# Patient Record
Sex: Male | Born: 1950 | Race: Black or African American | Hispanic: No | Marital: Married | Smoking: Current every day smoker
Health system: Southern US, Community
[De-identification: ages and names within clinical notes are randomized; demographics above are authoritative.]

## PROBLEM LIST (undated history)

## (undated) DIAGNOSIS — I10 Essential (primary) hypertension: Secondary | ICD-10-CM

## (undated) DIAGNOSIS — C8190 Hodgkin lymphoma, unspecified, unspecified site: Secondary | ICD-10-CM

## (undated) DIAGNOSIS — I426 Alcoholic cardiomyopathy: Secondary | ICD-10-CM

## (undated) DIAGNOSIS — E7800 Pure hypercholesterolemia, unspecified: Secondary | ICD-10-CM

## (undated) DIAGNOSIS — E079 Disorder of thyroid, unspecified: Secondary | ICD-10-CM

---

## 2014-11-06 DIAGNOSIS — I426 Alcoholic cardiomyopathy: Secondary | ICD-10-CM

## 2014-11-08 ENCOUNTER — Encounter (HOSPITAL_COMMUNITY): Payer: Self-pay | Admitting: Cardiology

## 2014-11-08 ENCOUNTER — Encounter (HOSPITAL_COMMUNITY)
Admission: RE | Disposition: A | Discharge status: Home / Self Care | Payer: Self-pay | Source: Ambulatory Visit | Attending: Cardiology

## 2014-11-08 ENCOUNTER — Ambulatory Visit (HOSPITAL_COMMUNITY)
Admission: RE | Admit: 2014-11-08 | Discharge: 2014-11-08 | Disposition: A | Discharge status: Home / Self Care | Payer: BLUE CROSS/BLUE SHIELD | Source: Ambulatory Visit | Attending: Cardiology | Admitting: Cardiology

## 2014-11-08 DIAGNOSIS — I429 Cardiomyopathy, unspecified: Secondary | ICD-10-CM | POA: Diagnosis not present

## 2014-11-08 DIAGNOSIS — E785 Hyperlipidemia, unspecified: Secondary | ICD-10-CM | POA: Insufficient documentation

## 2014-11-08 DIAGNOSIS — E119 Type 2 diabetes mellitus without complications: Secondary | ICD-10-CM | POA: Insufficient documentation

## 2014-11-08 DIAGNOSIS — I10 Essential (primary) hypertension: Secondary | ICD-10-CM | POA: Diagnosis not present

## 2014-11-08 DIAGNOSIS — I5042 Chronic combined systolic (congestive) and diastolic (congestive) heart failure: Secondary | ICD-10-CM | POA: Insufficient documentation

## 2014-11-08 DIAGNOSIS — I2510 Atherosclerotic heart disease of native coronary artery without angina pectoris: Secondary | ICD-10-CM | POA: Insufficient documentation

## 2014-11-08 DIAGNOSIS — I426 Alcoholic cardiomyopathy: Secondary | ICD-10-CM

## 2014-11-08 HISTORY — PX: LEFT HEART CATHETERIZATION WITH CORONARY ANGIOGRAM: SHX5451

## 2014-11-08 SURGERY — LEFT HEART CATHETERIZATION WITH CORONARY ANGIOGRAM
Anesthesia: LOCAL

## 2014-11-08 MED ORDER — SODIUM CHLORIDE 0.9 % IJ SOLN: 3.0000 mL | Freq: Two times a day (BID) | INTRAMUSCULAR | Status: DC

## 2014-11-08 MED ORDER — SODIUM CHLORIDE 0.9 % IV SOLN: 1.0000 mL/kg/h | INTRAVENOUS | Status: DC

## 2014-11-08 MED ORDER — SODIUM CHLORIDE 0.9 % IV SOLN: 250.0000 mL | INTRAVENOUS | Status: DC | PRN

## 2014-11-08 MED ORDER — SODIUM CHLORIDE 0.9 % IV SOLN
INTRAVENOUS | Status: DC
  Administered 2014-11-08: 1000 mL via INTRAVENOUS

## 2014-11-08 MED ORDER — SODIUM CHLORIDE 0.9 % IJ SOLN: 3.0000 mL | INTRAMUSCULAR | Status: DC | PRN

## 2014-11-08 MED ORDER — VERAPAMIL HCL 2.5 MG/ML IV SOLN
INTRAVENOUS | Status: AC
  Filled 2014-11-08: qty 2

## 2014-11-08 MED ORDER — NITROGLYCERIN 1 MG/10 ML FOR IR/CATH LAB
INTRA_ARTERIAL | Status: AC
  Filled 2014-11-08: qty 10

## 2014-11-08 MED ORDER — ASPIRIN 81 MG PO CHEW: 81.0000 mg | CHEWABLE_TABLET | ORAL | Status: DC

## 2014-11-08 MED ORDER — HEPARIN (PORCINE) IN NACL 2-0.9 UNIT/ML-% IJ SOLN
INTRAMUSCULAR | Status: AC
  Filled 2014-11-08: qty 1000

## 2014-11-08 MED ORDER — HYDROMORPHONE HCL 1 MG/ML IJ SOLN
INTRAMUSCULAR | Status: AC
  Filled 2014-11-08: qty 1

## 2014-11-08 MED ORDER — LIDOCAINE HCL (PF) 1 % IJ SOLN
INTRAMUSCULAR | Status: AC
  Filled 2014-11-08: qty 30

## 2014-11-08 MED ORDER — MIDAZOLAM HCL 2 MG/2ML IJ SOLN
INTRAMUSCULAR | Status: AC
  Filled 2014-11-08: qty 2

## 2014-11-08 MED ORDER — HEPARIN SODIUM (PORCINE) 1000 UNIT/ML IJ SOLN
INTRAMUSCULAR | Status: AC
  Filled 2014-11-08: qty 1

## 2014-11-08 NOTE — Interval H&P Note (Signed)
History and Physical Interval Note:  11/08/2014 11:24 AM  Spencer Chavez  has presented today for surgery, with the diagnosis of cardiomyopathy  The various methods of treatment have been discussed with the patient and family. After consideration of risks, benefits and other options for treatment, the patient has consented to  Procedure(s): LEFT HEART CATHETERIZATION WITH CORONARY ANGIOGRAM (N/A) and possible PCI as a surgical intervention .  The patient's history has been reviewed, patient examined, no change in status, stable for surgery.  I have reviewed the patient's chart and labs.  Questions were answered to the patient's satisfaction.  Ischemic Symptoms? CCS I (Ordinary physical activity does not cause anginal symptoms) Anti-ischemic Medical Therapy? Minimal Therapy (1 class of medications) Non-invasive Test Results? Intermediate-risk stress test findings: cardiac mortality 1-3%/year Prior CABG? No Previous CABG   Patient Information:   1-2V CAD, no prox LAD  U (5)  Indication: 16; Score: 5   Patient Information:   CTO of 1 vessel, no other CAD  U (4)  Indication: 26; Score: 4   Patient Information:   1V CAD with prox LAD  U (6)  Indication: 32; Score: 6   Patient Information:   2V-CAD with prox LAD  A (7)  Indication: 38; Score: 7   Patient Information:   3V-CAD without LMCA  A (7)  Indication: 44; Score: 7   Patient Information:   3V-CAD without LMCA With Abnormal LV systolic function  A (9)  Indication: 48; Score: 9   Patient Information:   LMCA-CAD  A (9)  Indication: 49; Score: 9   Patient Information:   2V-CAD with prox LAD PCI  A (7)  Indication: 62; Score: 7   Patient Information:   2V-CAD with prox LAD CABG  A (8)  Indication: 62; Score: 8   Patient Information:   3V-CAD without LMCA With Low CAD burden(i.e., 3 focal stenoses, low SYNTAX score) PCI  A (7)  Indication: 63; Score: 7   Patient Information:   3V-CAD  without LMCA With Low CAD burden(i.e., 3 focal stenoses, low SYNTAX score) CABG  A (9)  Indication: 63; Score: 9   Patient Information:   3V-CAD without LMCA E06c - Intermediate-high CAD burden (i.e., multiple diffuse lesions, presence of CTO, or high SYNTAX score) PCI  U (4)  Indication: 64; Score: 4   Patient Information:   3V-CAD without LMCA E06c - Intermediate-high CAD burden (i.e., multiple diffuse lesions, presence of CTO, or high SYNTAX score) CABG  A (9)  Indication: 64; Score: 9   Patient Information:   LMCA-CAD With Isolated LMCA stenosis  PCI  U (6)  Indication: 65; Score: 6   Patient Information:   LMCA-CAD With Isolated LMCA stenosis  CABG  A (9)  Indication: 65; Score: 9   Patient Information:   LMCA-CAD Additional CAD, low CAD burden (i.e., 1- to 2-vessel additional involvement, low SYNTAX score) PCI  U (5)  Indication: 66; Score: 5   Patient Information:   LMCA-CAD Additional CAD, low CAD burden (i.e., 1- to 2-vessel additional involvement, low SYNTAX score) CABG  A (9)  Indication: 66; Score: 9   Patient Information:   LMCA-CAD Additional CAD, intermediate-high CAD burden (i.e., 3-vessel involvement, presence of CTO, or high SYNTAX score) PCI  I (3)  Indication: 67; Score: 3   Patient Information:   LMCA-CAD Additional CAD, intermediate-high CAD burden (i.e., 3-vessel involvement, presence of CTO, or high SYNTAX score) CABG  A (9)  Indication: 67; Score: 9  Laverda Page

## 2014-11-08 NOTE — CV Procedure (Signed)
Procedure performed:  Left heart catheterization including hemodynamic monitoring of the left ventricle, LV gram, selective right and left coronary arteriography.  Indication patient is a  64 year-old  African-Americanmale with history of hypertension,  hyperlipidemia, Pre-Diabetes Mellitus   who presents with  Chronic systolic and diastolic heart failure, stable for colic cardiomyopathy , syncope, echocardiogram on 06/28/2014 had revealed ejection fraction of 30-35% , nuclear stress test revealed inferior wall scar with dilated left ventricle with ejection fraction of 23%. To evaluate cardiomyopathy, after patient had exhibited compliance with medical therapy, he was brought to the  Catheterization lab to evaluate the  coronary anatomy for definitive diagnosis of CAD.  Hemodynamic data:  Left ventricular pressure was  85/1 with LVEDP of  5 mm mercury. Aortic pressure was  87/60 with a mean of  72 mm mercury. There was no pressure gradient across the aortic valve  Left ventricle: Performed in the RAO projection revealed LVEF of 60%. There was No MR. No wall motion abnormality.  Right coronary artery: The vessel is smooth, excepting the proximal segment at the crux , there was a 20-30% stenosis. It was severely tortuous in the mid to distal segment. It is  Dominant.  Left main coronary artery is large and normal.  Circumflex coronary artery: A large vessel giving origin to a large obtuse marginal 1. It is smooth and normal.   LAD:  LAD gives origin to a large diagonal-1.  It is normal , tortuosity is evident.   Ramus intermediate: moderate caliber vessel, mildly tortuous, normal.    Impression: normal coronary arteries, except a 20% stenosis at the crux of the right coronary artery. Otherwise the rest of the vessels is smooth. The LVEF has improved  From 30-35% by echocardiogram to the present 60-65%.   Technique: Under sterile precautions using a 6 French right radial  arterial access, a 6  French sheath was introduced into the right radial artery. A 5 [place] Tig 4 catheter was advanced into the ascending aorta selective  right coronary artery and left coronary artery was cannulated and angiography was performed in multiple views. The catheter was pulled back Out of the body over exchange length J-wire.  Same Catheter was used to perform LV gram which was performed in RAO projection.  Catheter exchanged out of the body over J-Wire. NO immediate complications noted. Patient tolerated the procedure well.   Rec: Medical therapy with aggressive risk factor reduction.   Disposition: Will be discharged home today with outpatient follow up.

## 2014-11-08 NOTE — H&P (Signed)
  Please see office visit notes for complete details of HPI.  

## 2014-11-08 NOTE — Discharge Instructions (Signed)
Radial Site Care Refer to this sheet in the next few weeks. These instructions provide you with information on caring for yourself after your procedure. Your caregiver may also give you more specific instructions. Your treatment has been planned according to current medical practices, but problems sometimes occur. Call your caregiver if you have any problems or questions after your procedure. HOME CARE INSTRUCTIONS  You may shower the day after the procedure.Remove the bandage (dressing) and gently wash the site with plain soap and water.Gently pat the site dry.  Do not apply powder or lotion to the site.  Do not submerge the affected site in water for 3 to 5 days.  Inspect the site at least twice daily.  Do not flex or bend the affected arm for 24 hours.  No lifting over 5 pounds (2.3 kg) for 5 days after your procedure.  Do not drive home if you are discharged the same day of the procedure. Have someone else drive you.  You may drive 24 hours after the procedure unless otherwise instructed by your caregiver.  Do not operate machinery or power tools for 24 hours.  A responsible adult should be with you for the first 24 hours after you arrive home. What to expect:  Any bruising will usually fade within 1 to 2 weeks.  Blood that collects in the tissue (hematoma) may be painful to the touch. It should usually decrease in size and tenderness within 1 to 2 weeks. SEEK IMMEDIATE MEDICAL CARE IF:  You have unusual pain at the radial site.  You have redness, warmth, swelling, or pain at the radial site.  You have drainage (other than a small amount of blood on the dressing).  You have chills.  You have a fever or persistent symptoms for more than 72 hours.  You have a fever and your symptoms suddenly get worse.  Your arm becomes pale, cool, tingly, or numb.  You have heavy bleeding from the site. Hold pressure on the site. Document Released: 08/17/2010 Document Revised:  10/07/2011 Document Reviewed: 08/17/2010 [facility] Patient Information 2015 [place], [place]. This information is not intended to replace advice given to you by your health care provider. Make sure you discuss any questions you have with your health care provider.                                                                         Return To Work __________________________________________________ was treated at our facility. INJURY OR ILLNESS WAS: _____ Work-related __x___ Not work-related _____ Undetermined if work-related RETURN TO WORK  Employee may return to work on: ___________04/18/2016________________________ [place] Work activities not tolerated include: _____ Bending _____ Prolonged sitting _____ Lifting _____ Squatting _____ Prolonged standing _____ Lesle Reek _____ Reaching _____ Pushing and pulling _____ Walking __x___ Other ______none______________ Show this Return to Work statement to Optician, dispensing at work as soon as possible. Your employer should be aware of your condition and can help with the necessary work activity restrictions. If you wish to return to work sooner than the date above, or if you have further problems which make it difficult for you to return at that time, please call us or your caregiver. __________________Dr Ulice Dash Gangi_______________________ Physician Name (Printed) _________________DR Ulice Dash  Gangi________________________  _______________04/12/2016__________________________ Date   [employer] Patient Information 2015 [place]. This information is not intended to replace advice given to you by your health care provider. Make sure you discuss any questions you have with your health care provider.

## 2015-05-31 DIAGNOSIS — E039 Hypothyroidism, unspecified: Secondary | ICD-10-CM | POA: Diagnosis present

## 2015-05-31 DIAGNOSIS — I5042 Chronic combined systolic (congestive) and diastolic (congestive) heart failure: Secondary | ICD-10-CM | POA: Diagnosis present

## 2016-11-08 ENCOUNTER — Inpatient Hospital Stay (HOSPITAL_COMMUNITY)
Admission: EM | Admit: 2016-11-08 | Discharge: 2016-11-10 | DRG: 314 | Disposition: A | Discharge status: Home / Self Care | Payer: Medicare Other | Attending: Internal Medicine | Admitting: Internal Medicine

## 2016-11-08 ENCOUNTER — Encounter (HOSPITAL_COMMUNITY): Payer: Self-pay | Admitting: *Deleted

## 2016-11-08 ENCOUNTER — Emergency Department (HOSPITAL_COMMUNITY): Payer: Medicare Other

## 2016-11-08 DIAGNOSIS — Z7982 Long term (current) use of aspirin: Secondary | ICD-10-CM

## 2016-11-08 DIAGNOSIS — I426 Alcoholic cardiomyopathy: Secondary | ICD-10-CM | POA: Diagnosis present

## 2016-11-08 DIAGNOSIS — N179 Acute kidney failure, unspecified: Secondary | ICD-10-CM | POA: Diagnosis present

## 2016-11-08 DIAGNOSIS — I959 Hypotension, unspecified: Secondary | ICD-10-CM | POA: Diagnosis not present

## 2016-11-08 DIAGNOSIS — I5042 Chronic combined systolic (congestive) and diastolic (congestive) heart failure: Secondary | ICD-10-CM | POA: Diagnosis not present

## 2016-11-08 DIAGNOSIS — Z7289 Other problems related to lifestyle: Secondary | ICD-10-CM

## 2016-11-08 DIAGNOSIS — I10 Essential (primary) hypertension: Secondary | ICD-10-CM | POA: Diagnosis not present

## 2016-11-08 DIAGNOSIS — Z634 Disappearance and death of family member: Secondary | ICD-10-CM

## 2016-11-08 DIAGNOSIS — T464X5A Adverse effect of angiotensin-converting-enzyme inhibitors, initial encounter: Secondary | ICD-10-CM | POA: Diagnosis present

## 2016-11-08 DIAGNOSIS — E039 Hypothyroidism, unspecified: Secondary | ICD-10-CM | POA: Diagnosis present

## 2016-11-08 DIAGNOSIS — E43 Unspecified severe protein-calorie malnutrition: Secondary | ICD-10-CM | POA: Insufficient documentation

## 2016-11-08 DIAGNOSIS — Z79899 Other long term (current) drug therapy: Secondary | ICD-10-CM

## 2016-11-08 DIAGNOSIS — I9589 Other hypotension: Secondary | ICD-10-CM

## 2016-11-08 DIAGNOSIS — E860 Dehydration: Secondary | ICD-10-CM | POA: Diagnosis present

## 2016-11-08 DIAGNOSIS — Z8571 Personal history of Hodgkin lymphoma: Secondary | ICD-10-CM

## 2016-11-08 DIAGNOSIS — E785 Hyperlipidemia, unspecified: Secondary | ICD-10-CM | POA: Diagnosis present

## 2016-11-08 DIAGNOSIS — F4321 Adjustment disorder with depressed mood: Secondary | ICD-10-CM | POA: Diagnosis present

## 2016-11-08 DIAGNOSIS — I110 Hypertensive heart disease with heart failure: Secondary | ICD-10-CM | POA: Diagnosis present

## 2016-11-08 DIAGNOSIS — R7989 Other specified abnormal findings of blood chemistry: Secondary | ICD-10-CM | POA: Diagnosis present

## 2016-11-08 DIAGNOSIS — F17200 Nicotine dependence, unspecified, uncomplicated: Secondary | ICD-10-CM | POA: Diagnosis present

## 2016-11-08 HISTORY — DX: Disorder of thyroid, unspecified: E07.9

## 2016-11-08 HISTORY — DX: Pure hypercholesterolemia, unspecified: E78.00

## 2016-11-08 HISTORY — DX: Essential (primary) hypertension: I10

## 2016-11-08 HISTORY — DX: Hodgkin lymphoma, unspecified, unspecified site: C81.90

## 2016-11-08 HISTORY — DX: Alcoholic cardiomyopathy: I42.6

## 2016-11-08 LAB — CBC
HCT: 38.5 % — ABNORMAL LOW (ref 39.0–52.0)
HEMOGLOBIN: 13.3 g/dL (ref 13.0–17.0)
MCH: 33.2 pg (ref 26.0–34.0)
MCHC: 34.5 g/dL (ref 30.0–36.0)
MCV: 96 fL (ref 78.0–100.0)
Platelets: 203 10*3/uL (ref 150–400)
RBC: 4.01 MIL/uL — ABNORMAL LOW (ref 4.22–5.81)
RDW: 15.1 % (ref 11.5–15.5)
WBC: 6.5 10*3/uL (ref 4.0–10.5)

## 2016-11-08 LAB — BASIC METABOLIC PANEL
Anion gap: 13 (ref 5–15)
BUN: 29 mg/dL — ABNORMAL HIGH (ref 6–20)
CO2: 20 mmol/L — ABNORMAL LOW (ref 22–32)
Calcium: 9 mg/dL (ref 8.9–10.3)
Chloride: 103 mmol/L (ref 101–111)
Creatinine, Ser: 2.06 mg/dL — ABNORMAL HIGH (ref 0.61–1.24)
GFR calc Af Amer: 37 mL/min — ABNORMAL LOW (ref >60)
GFR, EST NON AFRICAN AMERICAN: 32 mL/min — AB (ref >60)
GLUCOSE: 120 mg/dL — AB (ref 65–99)
Potassium: 4.2 mmol/L (ref 3.5–5.1)
Sodium: 136 mmol/L (ref 135–145)

## 2016-11-08 LAB — SALICYLATE LEVEL: Salicylate Lvl: <7 mg/dL (ref 2.8–30.0)

## 2016-11-08 LAB — URINALYSIS, ROUTINE W REFLEX MICROSCOPIC
Bilirubin Urine: NEGATIVE
GLUCOSE, UA: NEGATIVE mg/dL
HGB URINE DIPSTICK: NEGATIVE
Ketones, ur: NEGATIVE mg/dL
NITRITE: NEGATIVE
PROTEIN: NEGATIVE mg/dL
Specific Gravity, Urine: 1.01 (ref 1.005–1.030)
Squamous Epithelial / LPF: NONE SEEN
pH: 5 (ref 5.0–8.0)

## 2016-11-08 LAB — CBG MONITORING, ED: GLUCOSE-CAPILLARY: 121 mg/dL — AB (ref 65–99)

## 2016-11-08 LAB — I-STAT TROPONIN, ED: Troponin i, poc: 0.08 ng/mL (ref 0.00–0.08)

## 2016-11-08 LAB — RAPID HIV SCREEN (HIV 1/2 AB+AG)
HIV 1/2 ANTIBODIES: NONREACTIVE
HIV-1 P24 ANTIGEN - HIV24: NONREACTIVE

## 2016-11-08 LAB — I-STAT CG4 LACTIC ACID, ED
LACTIC ACID, VENOUS: 2.58 mmol/L — AB (ref 0.5–1.9)
Lactic Acid, Venous: 2.01 mmol/L (ref 0.5–1.9)

## 2016-11-08 LAB — ETHANOL: Alcohol, Ethyl (B): 85 mg/dL — ABNORMAL HIGH (ref <5)

## 2016-11-08 LAB — BRAIN NATRIURETIC PEPTIDE: B NATRIURETIC PEPTIDE 5: 16.2 pg/mL (ref 0.0–100.0)

## 2016-11-08 LAB — ACETAMINOPHEN LEVEL: Acetaminophen (Tylenol), Serum: <10 ug/mL — ABNORMAL LOW (ref 10–30)

## 2016-11-08 MED ORDER — LEVOTHYROXINE SODIUM 50 MCG PO TABS
50.0000 ug | ORAL_TABLET | Freq: Every day | ORAL | Status: DC
  Administered 2016-11-09 – 2016-11-10 (×2): 50 ug via ORAL
  Filled 2016-11-08 (×2): qty 1

## 2016-11-08 MED ORDER — ATORVASTATIN CALCIUM 40 MG PO TABS
40.0000 mg | ORAL_TABLET | Freq: Every day | ORAL | Status: DC
  Administered 2016-11-09 – 2016-11-10 (×2): 40 mg via ORAL
  Filled 2016-11-08 (×2): qty 1

## 2016-11-08 MED ORDER — LORAZEPAM 1 MG PO TABS: 1.0000 mg | ORAL_TABLET | Freq: Four times a day (QID) | ORAL | Status: DC | PRN

## 2016-11-08 MED ORDER — SODIUM CHLORIDE 0.9 % IV SOLN
INTRAVENOUS | Status: DC
  Administered 2016-11-09 – 2016-11-10 (×4): via INTRAVENOUS

## 2016-11-08 MED ORDER — ENOXAPARIN SODIUM 40 MG/0.4ML ~~LOC~~ SOLN
40.0000 mg | Freq: Every day | SUBCUTANEOUS | Status: DC
  Administered 2016-11-09 – 2016-11-10 (×2): 40 mg via SUBCUTANEOUS
  Filled 2016-11-08 (×2): qty 0.4

## 2016-11-08 MED ORDER — SODIUM CHLORIDE 0.9 % IV BOLUS (SEPSIS)
1000.0000 mL | Freq: Once | INTRAVENOUS | Status: AC
Start: 2016-11-08 — End: 2016-11-08
  Administered 2016-11-08: 1000 mL via INTRAVENOUS

## 2016-11-08 MED ORDER — SODIUM CHLORIDE 0.9 % IV BOLUS (SEPSIS)
1000.0000 mL | Freq: Once | INTRAVENOUS | Status: AC
  Administered 2016-11-08: 1000 mL via INTRAVENOUS

## 2016-11-08 MED ORDER — ASPIRIN EC 81 MG PO TBEC
81.0000 mg | DELAYED_RELEASE_TABLET | Freq: Every day | ORAL | Status: DC
Start: 2016-11-09 — End: 2016-11-10
  Administered 2016-11-09 – 2016-11-10 (×2): 81 mg via ORAL
  Filled 2016-11-08 (×2): qty 1

## 2016-11-08 MED ORDER — MIRTAZAPINE 15 MG PO TABS
15.0000 mg | ORAL_TABLET | Freq: Every day | ORAL | Status: DC
  Administered 2016-11-09 (×2): 15 mg via ORAL
  Filled 2016-11-08 (×2): qty 1

## 2016-11-08 MED ORDER — SODIUM CHLORIDE 0.9% FLUSH
3.0000 mL | Freq: Two times a day (BID) | INTRAVENOUS | Status: DC
  Administered 2016-11-09 – 2016-11-10 (×4): 3 mL via INTRAVENOUS

## 2016-11-08 MED ORDER — VITAMIN B-1 100 MG PO TABS
100.0000 mg | ORAL_TABLET | Freq: Every day | ORAL | Status: DC
  Administered 2016-11-09 – 2016-11-10 (×2): 100 mg via ORAL
  Filled 2016-11-08 (×2): qty 1

## 2016-11-08 MED ORDER — FOLIC ACID 1 MG PO TABS
1.0000 mg | ORAL_TABLET | Freq: Every day | ORAL | Status: DC
  Administered 2016-11-09 – 2016-11-10 (×2): 1 mg via ORAL
  Filled 2016-11-08 (×2): qty 1

## 2016-11-08 MED ORDER — LORAZEPAM 2 MG/ML IJ SOLN: 1.0000 mg | Freq: Four times a day (QID) | INTRAMUSCULAR | Status: DC | PRN

## 2016-11-08 MED ORDER — ADULT MULTIVITAMIN W/MINERALS CH
1.0000 | ORAL_TABLET | Freq: Every day | ORAL | Status: DC
  Administered 2016-11-09 – 2016-11-10 (×2): 1 via ORAL
  Filled 2016-11-08 (×2): qty 1

## 2016-11-08 MED ORDER — THIAMINE HCL 100 MG/ML IJ SOLN: 100.0000 mg | Freq: Every day | INTRAMUSCULAR | Status: DC

## 2016-11-08 NOTE — H&P (Signed)
History and Physical  Patient Name: Spencer Chavez     HXT:[id number]    DOB: 08/02/1950    DOA: 11/08/2016 PCP: Wyvonnia Dusky, MD  Selmer Dominion, MD [facility]  Patient coming from: Home  Chief Complaint: Dizziness  HPI: Spencer Chavez is a 66 y.o. male with a past medical history significant for alcoholic cardiomyopathy, resolved, HTN, hypothyroidism, and history of Hodgkin's disease who presents with dizziness for 1 month.  The patient's wife died suddenly at [facility] at the beginning of February.  Since then, the patient has been grief-stricken, depressed, anergic, listless, and completely lacking in appetite/unable to eat.  His clinic notes 2 days ago state that he lost 13 lbs since Nov and 4 lbs since Feb, despite starting Remeron and taking Ensure.  He denies suicidality.  He has been progressively more weak and also orthostatic/severely dizzy with standing and finally today, his sister and brother convinced him to get checked out in the ER.  ED course: -Afebrile, heart rate 116, respirations 20, blood pressure initially 77/55, improved with fluids, pulse oximetry normal -Na 136, K 4.2, Cr 2.06 (baseline 1.0-1.7), WBC 6.5K, Hgb 13.7 -Lactate 2.58 -Chest x-ray cleared -Rapid HIV negative -Alcohol slightly elevated -Salicylates and acetaminophen negative -Troponin negative -BNP normal -He was given 2 L normal saline and TRH were asked to evaluate for hypotension    Patient is unclear about his history of cardiomyopathy.  LHC report by Dr. Einar Gip from 2016 mentions he had alcoholic cardiomyopathy, EF 35% on echocardiogram.  Subsequent LHC (after medical therapy) showed minimal atherosclerotic disease and resolved EF.  He has had no Cardiology follow up since.         ROS: Review of Systems  Respiratory: Negative for cough, sputum production and shortness of breath.   Cardiovascular: Negative for chest pain, orthopnea, leg swelling and PND.  Gastrointestinal:  Negative for abdominal pain, nausea and vomiting.  Genitourinary: Negative for dysuria, frequency and urgency.  Neurological: Positive for dizziness and weakness.  Psychiatric/Behavioral: Positive for depression and substance abuse. Negative for suicidal ideas.  All other systems reviewed and are negative.         Past Medical History:  Diagnosis Date  . Hypercholesteremia   . Hypertension   . Thyroid disease     Past Surgical History:  Procedure Laterality Date  . LEFT HEART CATHETERIZATION WITH CORONARY ANGIOGRAM N/A 11/08/2014   Procedure: LEFT HEART CATHETERIZATION WITH CORONARY ANGIOGRAM;  Surgeon: Adrian Prows, MD;  Location: [facility] CATH LAB;  Service: Cardiovascular;  Laterality: N/A;    Social History: Patient lives with his step daughter.  The patient walks unassisted.  He works part-time at [employer].  He smokes.  He uses alcohol daily.    No Known Allergies  Family history: family history includes Breast cancer in his mother; Hypertension in his father; Stroke in his father.  Prior to Admission medications   Medication Sig Start Date End Date Taking? Authorizing Provider  aspirin EC 81 MG tablet Take 81 mg by mouth daily.   Yes Historical Provider, MD  atorvastatin (LIPITOR) 40 MG tablet Take 40 mg by mouth daily.   Yes Historical Provider, MD  carvedilol (COREG) 6.25 MG tablet Take 6.25 mg by mouth 2 (two) times daily. 08/20/16  Yes Historical Provider, MD  levothyroxine (SYNTHROID, LEVOTHROID) 50 MCG tablet Take 50 mcg by mouth daily before breakfast.   Yes Historical Provider, MD  lisinopril (PRINIVIL,ZESTRIL) 10 MG tablet Take 10 mg by mouth daily.   Yes Historical  Provider, MD  mirtazapine (REMERON) 15 MG tablet Take 15 mg by mouth at bedtime.   Yes Historical Provider, MD       Physical Exam: BP 126/86   Pulse 79   Temp 98 F (36.7 C) (Oral)   Resp 14   Ht 6\' 3"  (1.905 m)   Wt 69.4 kg (153 lb)   SpO2 100%   BMI 19.12 kg/m  General appearance: Very thin adult  male, alert and in no acute distress.   Eyes: Anicteric, conjunctiva pink, lids and lashes normal. PERRL.    ENT: No nasal deformity, discharge, epistaxis.  Hearing normal. OP moist without lesions.   Neck: No neck masses.  Trachea midline.  No thyromegaly/tenderness. Lymph: No cervical or supraclavicular lymphadenopathy. Skin: Warm and dry.  No suspicious rashes or lesions. Cardiac: RRR, nl S1-S2, gallop? Vs fixed split S2.  Capillary refill is brisk.  JVP not elevated.  No LE edema.  Radial and DP pulses 2+ and symmetric. Respiratory: Normal respiratory rate and rhythm.  CTAB without rales or wheezes. Abdomen: Abdomen soft.  No TTP. No ascites, distension, hepatosplenomegaly.   MSK: No deformities or effusions.  No cyanosis or clubbing. Neuro: Cranial nerves normal.  Sensation intact to light touch. Speech is fluent.  Muscle strength normal.    Psych: Sensorium intact and responding to questions, attention normal.  Behavior appropriate.  Affect normal.  Judgment and insight appear normal.     Labs on Admission:  I have personally reviewed following labs and imaging studies: CBC:  Recent Labs Lab 11/08/16 1715  WBC 6.5  HGB 13.3  HCT 38.5*  MCV 96.0  PLT 333   Basic Metabolic Panel:  Recent Labs Lab 11/08/16 1715  NA 136  K 4.2  CL 103  CO2 20*  GLUCOSE 120*  BUN 29*  CREATININE 2.06*  CALCIUM 9.0   GFR: Estimated Creatinine Clearance: 35.1 mL/min (A) (by C-G formula based on SCr of 2.06 mg/dL (H)).  Liver Function Tests: No results for input(s): AST, ALT, ALKPHOS, BILITOT, PROT, ALBUMIN in the last 168 hours. No results for input(s): LIPASE, AMYLASE in the last 168 hours. No results for input(s): AMMONIA in the last 168 hours. Coagulation Profile: No results for input(s): INR, PROTIME in the last 168 hours. Cardiac Enzymes: No results for input(s): CKTOTAL, CKMB, CKMBINDEX, TROPONINI in the last 168 hours. BNP (last 3 results) No results for input(s): PROBNP in  the last 8760 hours. HbA1C: No results for input(s): HGBA1C in the last 72 hours. CBG:  Recent Labs Lab 11/08/16 1713  GLUCAP 121*   Lipid Profile: No results for input(s): CHOL, HDL, LDLCALC, TRIG, CHOLHDL, LDLDIRECT in the last 72 hours. Thyroid Function Tests: No results for input(s): TSH, T4TOTAL, FREET4, T3FREE, THYROIDAB in the last 72 hours. Anemia Panel: No results for input(s): VITAMINB12, FOLATE, FERRITIN, TIBC, IRON, RETICCTPCT in the last 72 hours. Sepsis Labs: Lactic acid 2.5 --> 2 Invalid input(s): PROCALCITONIN, LACTICIDVEN No results found for this or any previous visit (from the past 240 hour(s)).       Radiological Exams on Admission: Personally reviewed CXR clear: Dg Chest 2 View  Result Date: 11/08/2016 CLINICAL DATA:  Dizziness for 1 month.  Hypotension. EXAM: CHEST  2 VIEW COMPARISON:  None. FINDINGS: Atherosclerotic calcification of the aortic arch. Heart size within normal limits. The lungs appear clear. Abdominal aortic atherosclerotic calcification. No significant bony abnormality. IMPRESSION: 1. Atherosclerosis, but no acute radiographic findings. Electronically Signed   By: Cindra Eves.D.  On: 11/08/2016 18:57    EKG: Independently reviewed. Rate 112, Qtc 469, no ST changes.       Assessment/Plan  1. Hypotension, dehydration:  Despite lactate greater than 2, he has no fever and leukocytosis, nor localizing symptoms of infection, infection is doubted.  Suspect this is dehydration, alcohol use, and antihypertensive use. -IVF -Hold antihypertensives -Obtain Echocardiogram -Trend lactic acid, CEs   2. Acute kidney injury:  Suspect pre-renal plus ACEi related. -Hold ACEi, antihypertensives -Fluids -Obtain urine electrolytes, protein and UA microscopy -Repeat BMP in AM  3. Hypothyroidism:  -Continue levothyroxine  4. Essential hypertension and CVA prevention and hx of CM:  Hypotensive. -Hold antihypertensives -Continue statin,  aspirin -Smoking cessation recommended, modalities discussed  5. Alcohol use:  -CIWA -Consult to SW re: treatment as OP  6. Grief:  Encouraged counsleing at his PCP's office.  Emotional support offered. -Continue mirtazapine       DVT prophylaxis: Lovenox  Code Status: FULL  Family Communication: Sister and brother at bedside  Disposition Plan: Anticipate IV fluids, trend BMP, obstain echo.  If echo normal, asymptomatic tomorrow and lactate, Cr have improved, discharge home with follow up with PCP for counseling, BP med titration. Consults called: None Admission status: OBS At the point of initial evaluation, it is my clinical opinion that admission for OBSERVATION is reasonable and necessary because the patient's presenting complaints in the context of their chronic conditions represent sufficient risk of deterioration or significant morbidity to constitute reasonable grounds for close observation in the hospital setting, but that the patient may be medically stable for discharge from the hospital within 24 to 48 hours.    Medical decision making: Patient seen at 10:00 PM on 11/08/2016.  The patient was discussed with Dr. Sandi Mariscal.  What exists of the patient's chart was reviewed in depth and summarized above.  Clinical condition: stable.        Edwin Dada [facility] Pager [phone]

## 2016-11-08 NOTE — ED Triage Notes (Signed)
PT states he has been dizzy for 1 month.  States recently he becomes dizzy every time he stands up.  BP 77/55 in triage.

## 2016-11-08 NOTE — Progress Notes (Deleted)
n

## 2016-11-08 NOTE — ED Notes (Signed)
Patient transported to X-ray 

## 2016-11-08 NOTE — ED Provider Notes (Signed)
[facility] DEPT Provider Note   CSN: [id number] Arrival date & time: 11/08/16  1636     History   Chief Complaint Chief Complaint  Patient presents with  . Dizziness    HPI Spencer Chavez is a 66 y.o. male.  HPI   66 yo presenting with history significant for heart disease, thyroid r, hypertension, hyperlipidemia. He is presenting today with feelings of dizziness. Patient was hypotensive to 70 systolic on arrival. Patient reports that he's not been eating very well for the last 6 months. Family members state that his wife died 55 months ago and he's had trouble eating, drinking. Patient denies any SI or HI. He does endorse feelings of being sad. He denies any other B symptoms aside from a 13 pound weight loss.  Denies any cough urine symptoms.  Past Medical History:  Diagnosis Date  . Hypercholesteremia   . Hypertension   . Thyroid disease     Patient Active Problem List   Diagnosis Date Noted  . Cardiomyopathy, alcoholic ([facility]) 08/65/7846    Past Surgical History:  Procedure Laterality Date  . LEFT HEART CATHETERIZATION WITH CORONARY ANGIOGRAM N/A 11/08/2014   Procedure: LEFT HEART CATHETERIZATION WITH CORONARY ANGIOGRAM;  Surgeon: Adrian Prows, MD;  Location: [facility] CATH LAB;  Service: Cardiovascular;  Laterality: N/A;       Home Medications    Prior to Admission medications   Medication Sig Start Date End Date Taking? Authorizing Provider  aspirin EC 81 MG tablet Take 81 mg by mouth daily.   Yes Historical Provider, MD  atorvastatin (LIPITOR) 40 MG tablet Take 40 mg by mouth daily.   Yes Historical Provider, MD  carvedilol (COREG) 6.25 MG tablet Take 6.25 mg by mouth 2 (two) times daily. 10/11/14  Yes Historical Provider, MD  levothyroxine (SYNTHROID, LEVOTHROID) 50 MCG tablet Take 50 mcg by mouth daily before breakfast.   Yes Historical Provider, MD  lisinopril (PRINIVIL,ZESTRIL) 5 MG tablet Take 10 mg by mouth daily at 12 noon.  10/17/14  Yes Historical Provider, MD   mirtazapine (REMERON) 15 MG tablet Take 15 mg by mouth at bedtime.   Yes Historical Provider, MD  fenofibrate 54 MG tablet Take 54 mg by mouth daily.    Historical Provider, MD    Family History No family history on file.  Social History Social History  Substance Use Topics  . Smoking status: Current Every Day Smoker    Packs/day: 0.50  . Smokeless tobacco: Never Used  . Alcohol use Yes     Comment: occ     Allergies   Patient has no known allergies.   Review of Systems Review of Systems  Constitutional: Negative for activity change.  Respiratory: Negative for shortness of breath.   Cardiovascular: Negative for chest pain.  Gastrointestinal: Negative for abdominal pain.  Neurological: Positive for dizziness and light-headedness.  All other systems reviewed and are negative.    Physical Exam Updated Vital Signs BP (!) 85/60   Pulse (!) 102   Temp 98 F (36.7 C) (Oral)   Resp (!) 21   Ht 6\' 3"  (1.905 m)   Wt 153 lb (69.4 kg)   SpO2 98%   BMI 19.12 kg/m   Physical Exam  Constitutional: He is oriented to person, place, and time. He appears well-nourished.  Thin 66 year old male.  HENT:  Head: Normocephalic.  Eyes: Conjunctivae are normal. Right eye exhibits no discharge. Left eye exhibits no discharge.  Cardiovascular: Normal rate and regular rhythm.   Pulmonary/Chest: Effort normal  and breath sounds normal. No respiratory distress. He has no wheezes.  Neurological: He is oriented to person, place, and time.  Skin: Skin is warm and dry. He is not diaphoretic.  Psychiatric: He has a normal mood and affect. His behavior is normal.  Slightly reserved.     ED Treatments / Results  Labs (all labs ordered are listed, but only abnormal results are displayed) Labs Reviewed  CBC - Abnormal; Notable for the following:       Result Value   RBC 4.01 (*)    HCT 38.5 (*)    All other components within normal limits  CBG MONITORING, ED - Abnormal; Notable for the  following:    Glucose-Capillary 121 (*)    All other components within normal limits  BASIC METABOLIC PANEL  URINALYSIS, ROUTINE W REFLEX MICROSCOPIC  BRAIN NATRIURETIC PEPTIDE  RAPID HIV SCREEN (HIV 1/2 AB+AG)  RPR  ETHANOL  SALICYLATE LEVEL  ACETAMINOPHEN LEVEL  I-STAT TROPOININ, ED  I-STAT CG4 LACTIC ACID, ED    EKG  EKG Interpretation None       Radiology No results found.  Procedures Procedures (including critical care time)  Medications Ordered in ED Medications  sodium chloride 0.9 % bolus 1,000 mL (not administered)     Initial Impression / Assessment and Plan / ED Course  I have reviewed the triage vital signs and the nursing notes.  Pertinent labs & imaging results that were available during my care of the patient were reviewed by me and considered in my medical decision making (see chart for details).    Patient is a 66 year old male with hypertension hyperlipidemia presenting today with dizziness. Patient has very low blood pressure arrival. I think this is likely to 13 pound weight loss in the last 6 months. Patient's been eating and drinking less likely because of mild depression from his wife. Patient has been on the same blood pressure medications since prior to losing all that weight. I think the hypotension  is likely combination of med effect and  poor by mouth intake. We'll give an initial liter fluid, get labs. No signs or symptoms of infectious process. Will admit for further observation.    Final Clinical Impressions(s) / ED Diagnoses   Final diagnoses:  None    New Prescriptions New Prescriptions   No medications on file     Denissa Cozart Julio Alm, MD 11/08/16 2335

## 2016-11-08 NOTE — ED Notes (Signed)
Pt back from XR 

## 2016-11-09 DIAGNOSIS — I110 Hypertensive heart disease with heart failure: Secondary | ICD-10-CM | POA: Diagnosis present

## 2016-11-09 DIAGNOSIS — Z8571 Personal history of Hodgkin lymphoma: Secondary | ICD-10-CM | POA: Diagnosis not present

## 2016-11-09 DIAGNOSIS — I5042 Chronic combined systolic (congestive) and diastolic (congestive) heart failure: Secondary | ICD-10-CM | POA: Diagnosis present

## 2016-11-09 DIAGNOSIS — E43 Unspecified severe protein-calorie malnutrition: Secondary | ICD-10-CM | POA: Diagnosis present

## 2016-11-09 DIAGNOSIS — R7989 Other specified abnormal findings of blood chemistry: Secondary | ICD-10-CM | POA: Diagnosis present

## 2016-11-09 DIAGNOSIS — I426 Alcoholic cardiomyopathy: Secondary | ICD-10-CM | POA: Diagnosis present

## 2016-11-09 DIAGNOSIS — E785 Hyperlipidemia, unspecified: Secondary | ICD-10-CM | POA: Diagnosis present

## 2016-11-09 DIAGNOSIS — Z634 Disappearance and death of family member: Secondary | ICD-10-CM | POA: Diagnosis not present

## 2016-11-09 DIAGNOSIS — I959 Hypotension, unspecified: Secondary | ICD-10-CM | POA: Diagnosis present

## 2016-11-09 DIAGNOSIS — E860 Dehydration: Secondary | ICD-10-CM | POA: Diagnosis present

## 2016-11-09 DIAGNOSIS — Z7289 Other problems related to lifestyle: Secondary | ICD-10-CM | POA: Diagnosis not present

## 2016-11-09 DIAGNOSIS — Z79899 Other long term (current) drug therapy: Secondary | ICD-10-CM | POA: Diagnosis not present

## 2016-11-09 DIAGNOSIS — T464X5A Adverse effect of angiotensin-converting-enzyme inhibitors, initial encounter: Secondary | ICD-10-CM | POA: Diagnosis present

## 2016-11-09 DIAGNOSIS — I429 Cardiomyopathy, unspecified: Secondary | ICD-10-CM | POA: Diagnosis not present

## 2016-11-09 DIAGNOSIS — F17200 Nicotine dependence, unspecified, uncomplicated: Secondary | ICD-10-CM | POA: Diagnosis present

## 2016-11-09 DIAGNOSIS — F4321 Adjustment disorder with depressed mood: Secondary | ICD-10-CM | POA: Diagnosis present

## 2016-11-09 DIAGNOSIS — E039 Hypothyroidism, unspecified: Secondary | ICD-10-CM | POA: Diagnosis present

## 2016-11-09 DIAGNOSIS — Z7982 Long term (current) use of aspirin: Secondary | ICD-10-CM | POA: Diagnosis not present

## 2016-11-09 DIAGNOSIS — N179 Acute kidney failure, unspecified: Secondary | ICD-10-CM | POA: Diagnosis present

## 2016-11-09 DIAGNOSIS — I9589 Other hypotension: Secondary | ICD-10-CM | POA: Diagnosis not present

## 2016-11-09 LAB — COMPREHENSIVE METABOLIC PANEL
ALT: 39 U/L (ref 17–63)
ANION GAP: 3 — AB (ref 5–15)
AST: 47 U/L — AB (ref 15–41)
Albumin: 3 g/dL — ABNORMAL LOW (ref 3.5–5.0)
Alkaline Phosphatase: 44 U/L (ref 38–126)
BILIRUBIN TOTAL: 0.6 mg/dL (ref 0.3–1.2)
BUN: 20 mg/dL (ref 6–20)
CO2: 23 mmol/L (ref 22–32)
Calcium: 7.8 mg/dL — ABNORMAL LOW (ref 8.9–10.3)
Chloride: 111 mmol/L (ref 101–111)
Creatinine, Ser: 1.45 mg/dL — ABNORMAL HIGH (ref 0.61–1.24)
GFR calc Af Amer: 57 mL/min — ABNORMAL LOW (ref >60)
GFR calc non Af Amer: 49 mL/min — ABNORMAL LOW (ref >60)
GLUCOSE: 106 mg/dL — AB (ref 65–99)
POTASSIUM: 4.5 mmol/L (ref 3.5–5.1)
Sodium: 137 mmol/L (ref 135–145)
TOTAL PROTEIN: 5.4 g/dL — AB (ref 6.5–8.1)

## 2016-11-09 LAB — PROTEIN / CREATININE RATIO, URINE: CREATININE, URINE: 53.98 mg/dL

## 2016-11-09 LAB — CBC
HEMATOCRIT: 32.1 % — AB (ref 39.0–52.0)
HEMOGLOBIN: 11.1 g/dL — AB (ref 13.0–17.0)
MCH: 33.6 pg (ref 26.0–34.0)
MCHC: 34.6 g/dL (ref 30.0–36.0)
MCV: 97.3 fL (ref 78.0–100.0)
Platelets: 168 10*3/uL (ref 150–400)
RBC: 3.3 MIL/uL — ABNORMAL LOW (ref 4.22–5.81)
RDW: 16.1 % — AB (ref 11.5–15.5)
WBC: 4.4 10*3/uL (ref 4.0–10.5)

## 2016-11-09 LAB — PROTIME-INR
INR: 1.31
Prothrombin Time: 16.4 seconds — ABNORMAL HIGH (ref 11.4–15.2)

## 2016-11-09 LAB — LACTIC ACID, PLASMA: Lactic Acid, Venous: 1.1 mmol/L (ref 0.5–1.9)

## 2016-11-09 LAB — RPR: RPR Ser Ql: NONREACTIVE

## 2016-11-09 LAB — TROPONIN I: Troponin I: 0.04 ng/mL (ref <0.03)

## 2016-11-09 LAB — SODIUM, URINE, RANDOM: Sodium, Ur: 65 mmol/L

## 2016-11-09 LAB — CREATININE, URINE, RANDOM: Creatinine, Urine: 54.03 mg/dL

## 2016-11-09 MED ORDER — ENSURE ENLIVE PO LIQD
237.0000 mL | Freq: Two times a day (BID) | ORAL | Status: DC
  Administered 2016-11-09 – 2016-11-10 (×3): 237 mL via ORAL

## 2016-11-09 MED ORDER — ACETAMINOPHEN 325 MG PO TABS: 650.0000 mg | ORAL_TABLET | Freq: Once | ORAL | Status: DC

## 2016-11-09 NOTE — Progress Notes (Addendum)
Pt arrived floor. Settled in the room. Cardiac telemetry initiated. Vital signs and assessment completed. Will continue to monitor.

## 2016-11-09 NOTE — Progress Notes (Signed)
CRITICAL VALUE ALERT  Critical value received:  Troponin 0.04   Date of notification:  11/09/16  Time of notification:  6:31am  Critical value read back:Yes.    Nurse who received alert:  Kenna Gilbert, RN  MD notified (1st page):  Donnal Debar, NP  Time of first page:  6:39am  MD notified (2nd page):  Time of second page:  Responding MD:  Donnal Debar, NP  Time MD responded:  6:41am

## 2016-11-09 NOTE — Evaluation (Signed)
Physical Therapy Evaluation Patient Details Name: Spencer Chavez MRN: [id number] DOB: 1950-11-15 Today's Date: 11/09/2016   History of Present Illness  Patient is a 66 yo male admitted 11/08/16 with dizziness and weight loss due to grievance after his wife passed away. Patient with dehydration, hypotension, AKI.    PMH:  alcoholic cardiomyopathy, HTN, hypothyroidism, and history of Hodgkin's disease   Clinical Impression  Patient is functioning at independent level for all mobility and gait.  Able to negotiate stairs with use of 1 rail and no physical assist.  No PT needs identified - PT will sign off.    Follow Up Recommendations No PT follow up;Supervision - Intermittent    Equipment Recommendations  None recommended by PT    Recommendations for Other Services       Precautions / Restrictions Precautions Precautions: None Restrictions Weight Bearing Restrictions: No      Mobility  Bed Mobility Overal bed mobility: Independent                Transfers Overall transfer level: Independent Equipment used: None                Ambulation/Gait Ambulation/Gait assistance: Independent Ambulation Distance (Feet): 250 Feet Assistive device: None Gait Pattern/deviations: WFL(Within Functional Limits) Gait velocity: WFL Gait velocity interpretation: at or above normal speed for age/gender General Gait Details: Patient with good gait pattern, balance, and speed.  No loss of balance during gait.  Stairs Stairs: Yes Stairs assistance: Modified independent (Device/Increase time) Stair Management: One rail Right;Alternating pattern;Forwards Number of Stairs: 3 General stair comments: Patient able to negotiate stairs using alternating pattern and 1 rail.  No assist needed.  Wheelchair Mobility    Modified Rankin (Stroke Patients Only)       Balance Overall balance assessment: No apparent balance deficits (not formally assessed)                            High level balance activites: Backward walking;Direction changes;Turns;Sudden stops;Head turns High Level Balance Comments: No loss of balance during high level activities             Pertinent Vitals/Pain Pain Assessment: No/denies pain    Home Living Family/patient expects to be discharged to:: Private residence Living Arrangements: Alone (Per chart, patient lives with step-daughter) Available Help at Discharge: Family;Available PRN/intermittently (Brother, sister in area) Type of Home: House Home Access: Stairs to enter   Technical brewer of Steps: 2 Home Layout: Two level;Bed/bath upstairs Home Equipment: None      Prior Function Level of Independence: Independent         Comments: Works     Journalist, newspaper        Extremity/Trunk Assessment   Upper Extremity Assessment Upper Extremity Assessment: Overall WFL for tasks assessed    Lower Extremity Assessment Lower Extremity Assessment: Overall WFL for tasks assessed    Cervical / Trunk Assessment Cervical / Trunk Assessment: Normal  Communication   Communication: No difficulties  Cognition Arousal/Alertness: Awake/alert Behavior During Therapy: WFL for tasks assessed/performed;Flat affect (Did smile several times during session.) Overall Cognitive Status: Within Functional Limits for tasks assessed                                        General Comments      Exercises     Assessment/Plan    PT  Assessment Patent does not need any further PT services  PT Problem List         PT Treatment Interventions      PT Goals (Current goals can be found in the Care Plan section)  Acute Rehab PT Goals PT Goal Formulation: All assessment and education complete, DC therapy    Frequency     Barriers to discharge        Co-evaluation               End of Session Equipment Utilized During Treatment: Gait belt Activity Tolerance: Patient tolerated treatment  well Patient left: in bed;with call bell/phone within reach Nurse Communication: Mobility status PT Visit Diagnosis: Muscle weakness (generalized) (M62.81)    Time: 9090-3014 PT Time Calculation (min) (ACUTE ONLY): 14 min   Charges:   PT Evaluation $PT Eval Low Complexity: 1 Procedure     PT G Codes:   PT G-Codes **NOT FOR INPATIENT CLASS** Functional Assessment Tool Used: AM-PAC 6 Clicks Basic Mobility Functional Limitation: Mobility: Walking and moving around Mobility: Walking and Moving Around Current Status (F9692): 0 percent impaired, limited or restricted Mobility: Walking and Moving Around Goal Status (S9324): 0 percent impaired, limited or restricted Mobility: Walking and Moving Around Discharge Status (N9914): 0 percent impaired, limited or restricted    Carita Pian. Sanjuana Kava, [facility] Acute [facility] Pager [place] 11/09/2016, 6:18 PM

## 2016-11-09 NOTE — Progress Notes (Signed)
Pt reports headache. No tylenol on file. MD on call was notified.

## 2016-11-09 NOTE — Progress Notes (Signed)
Initial Nutrition Assessment  DOCUMENTATION CODES:   Severe malnutrition in context of acute illness/injury  INTERVENTION:  Continue Ensure Enlive po BID, each supplement provides 350 kcal and 20 grams of protein.  Encourage adequate PO intake.   NUTRITION DIAGNOSIS:   Malnutrition (severe) related to acute illness as evidenced by percent weight loss, moderate depletions of muscle mass.  GOAL:   Patient will meet greater than or equal to 90% of their needs  MONITOR:   PO intake, Supplement acceptance, Labs, Weight trends, Skin, I & O's  REASON FOR ASSESSMENT:   Consult, Malnutrition Screening Tool Assessment of nutrition requirement/status  ASSESSMENT:   66 y.o. male with a past medical history significant for alcoholic cardiomyopathy, resolved, HTN, hypothyroidism, and history of Hodgkin's disease who presents with dizziness for 1 month.  Pt reports having a good appetite currently and PTA with usual consumption of at least 2-3 meals a day with Ensure shakes at least three times daily. Pt reports eating well this AM at breakfast and says he consumes mostly all of his meal. Pt does report weight loss since January with usual body weight of ~172 lbs. Pt reports most current weight is ~155 lbs. Pt with a reported 11% weight loss in 3 months which is significant for time frame. Pt currently has Ensure ordered and has been consuming them. RD to continue with current orders.   Nutrition-Focused physical exam completed. Findings are no fat depletion, moderate muscle depletion, and no edema.   Labs and medications reviewed.   Diet Order:  Diet Heart Room service appropriate? Yes; Fluid consistency: Thin  Skin:  Reviewed, no issues  Last BM:  4/13  Height:   Ht Readings from Last 1 Encounters:  11/08/16 6\' 3"  (1.905 m)    Weight:   Wt Readings from Last 1 Encounters:  11/08/16 153 lb (69.4 kg)    Ideal Body Weight:  89 kg  BMI:  Body mass index is 19.12  kg/m.  Estimated Nutritional Needs:   Kcal:  2050-2250  Protein:  100-110 grams  Fluid:  2 - 2.2 L/day  EDUCATION NEEDS:   No education needs identified at this time  Corrin Parker, MS, RD, LDN Pager # [phone] After hours/ weekend pager # [phone]

## 2016-11-09 NOTE — Progress Notes (Signed)
Patient ID: Spencer Chavez, male   DOB: 1951-06-12, 66 y.o.   MRN: [id number]    PROGRESS NOTE    Spencer Chavez  NTI:[id number] DOB: [birth year] DOA: 11/08/2016  PCP: Wyvonnia Dusky, MD   Brief Narrative:  66 y.o. male with alcoholic cardiomyopathy, HTN, hypothyroidism, and history of Hodgkin's disease who presened with dizziness for 1 month associated with 13 lbs + weight loss due to grievance after his wife passed away.   Assessment & Plan:  Hypotension, dehydration:  Despite lactate greater than 2, he has no fever and leukocytosis, nor localizing symptoms of infection, infection is doubted.  Suspect this is dehydration, alcohol use, and antihypertensive use. - responded to IVF, SBP now in 120's - continue to monitor - encouraged oral intake - possibly stop IVF in next 24 hours - lactic acid has cleared  - PT eval   Acute kidney injury - Suspect pre-renal plus ACEi related. - continue to hold ACEi, antihypertensives - Cr is trending down with IVF - BMP In AM  Hypothyroidism - Continue levothyroxine  Essential hypertension and CVA prevention and hx of CM - on IVF, antihypertensives held until BP stabilizes and remains stable for at least 24 hours   Mild elevation in trop - from dehydration  - no chest pain this AM - trop elevation not in ACS pattern   Alcohol use - no signs of withdrawal - keep on CIWA  Grief - Encouraged counsleing at his PCP's office.  Emotional support offered. - Continue mirtazapine  DVT prophylaxis: Lovenox SQ Code Status: Full  Family Communication: Patient at bedside  Disposition Plan: home in 102 days   Consultants:   None  Procedures:   None  Antimicrobials:   None  Subjective: No events overnight.   Objective: Vitals:   11/08/16 1915 11/08/16 2135 11/08/16 2215 11/08/16 2308  BP: 103/78 105/77 126/86 121/71  Pulse: 87 86 79 80  Resp: 14 12 14 18   Temp:    98.1 F (36.7 C)  TempSrc:      SpO2: 100%  100% 100% 100%  Weight:      Height:        Intake/Output Summary (Last 24 hours) at 11/09/16 1215 Last data filed at 11/09/16 1047  Gross per 24 hour  Intake          2277.08 ml  Output                0 ml  Net          2277.08 ml   Filed Weights   11/08/16 1649  Weight: 69.4 kg (153 lb)    Examination:  General exam: Appears calm and comfortable  Respiratory system: Clear to auscultation. Respiratory effort normal. Cardiovascular system: S1 & S2 heard, RRR. No JVD, murmurs, rubs, gallops or clicks. No pedal edema. Gastrointestinal system: Abdomen is nondistended, soft and nontender. No organomegaly or masses felt. Normal bowel sounds heard. Central nervous system: Alert and oriented. No focal neurological deficits. Extremities: Symmetric 5 x 5 power. Skin: No rashes, lesions or ulcers Psychiatry: Judgement and insight appear normal. Mood & affect appropriate.    Data Reviewed: I have personally reviewed following labs and imaging studies  CBC:  Recent Labs Lab 11/08/16 1715 11/09/16 0513  WBC 6.5 4.4  HGB 13.3 11.1*  HCT 38.5* 32.1*  MCV 96.0 97.3  PLT 203 867   Basic Metabolic Panel:  Recent Labs Lab 11/08/16 1715 11/09/16 0513  NA 136 137  K 4.2 4.5  CL 103 111  CO2 20* 23  GLUCOSE 120* 106*  BUN 29* 20  CREATININE 2.06* 1.45*  CALCIUM 9.0 7.8*   Liver Function Tests:  Recent Labs Lab 11/09/16 0513  AST 47*  ALT 39  ALKPHOS 44  BILITOT 0.6  PROT 5.4*  ALBUMIN 3.0*   Coagulation Profile:  Recent Labs Lab 11/09/16 0513  INR 1.31   Cardiac Enzymes:  Recent Labs Lab 11/09/16 0513  TROPONINI 0.04*   CBG:  Recent Labs Lab 11/08/16 1713  GLUCAP 121*   Urine analysis:    Component Value Date/Time   COLORURINE YELLOW 11/08/2016 2046   APPEARANCEUR CLEAR 11/08/2016 2046   LABSPEC 1.010 11/08/2016 2046   PHURINE 5.0 11/08/2016 2046   GLUCOSEU NEGATIVE 11/08/2016 2046   HGBUR NEGATIVE 11/08/2016 2046   BILIRUBINUR NEGATIVE  11/08/2016 2046   KETONESUR NEGATIVE 11/08/2016 2046   PROTEINUR NEGATIVE 11/08/2016 2046   NITRITE NEGATIVE 11/08/2016 2046   LEUKOCYTESUR TRACE (A) 11/08/2016 2046   Radiology Studies: Dg Chest 2 View  Result Date: 11/08/2016 CLINICAL DATA:  Dizziness for 1 month.  Hypotension. EXAM: CHEST  2 VIEW COMPARISON:  None. FINDINGS: Atherosclerotic calcification of the aortic arch. Heart size within normal limits. The lungs appear clear. Abdominal aortic atherosclerotic calcification. No significant bony abnormality. IMPRESSION: 1. Atherosclerosis, but no acute radiographic findings. Electronically Signed   By: Van Clines M.D.   On: 11/08/2016 18:57   Scheduled Meds: . acetaminophen  650 mg Oral Once  . aspirin EC  81 mg Oral Daily  . atorvastatin  40 mg Oral Daily  . enoxaparin (LOVENOX) injection  40 mg Subcutaneous Daily  . feeding supplement (ENSURE ENLIVE)  237 mL Oral BID BM  . folic acid  1 mg Oral Daily  . levothyroxine  50 mcg Oral QAC breakfast  . mirtazapine  15 mg Oral QHS  . multivitamin with minerals  1 tablet Oral Daily  . sodium chloride flush  3 mL Intravenous Q12H  . thiamine  100 mg Oral Daily   Or  . thiamine  100 mg Intravenous Daily   Continuous Infusions: . sodium chloride 125 mL/hr at 11/09/16 0836     LOS: 0 days   Time spent: 20 minutes   Faye Ramsay, MD [facility] Pager [phone]  If 7PM-7AM, please contact night-coverage [email or web address] Password [facility] 11/09/2016, 12:15 PM

## 2016-11-10 ENCOUNTER — Inpatient Hospital Stay (HOSPITAL_COMMUNITY): Payer: Medicare Other

## 2016-11-10 DIAGNOSIS — I429 Cardiomyopathy, unspecified: Secondary | ICD-10-CM

## 2016-11-10 LAB — ECHOCARDIOGRAM COMPLETE
Area-P 1/2: 1.79 cm2
CHL CUP DOP CALC LVOT VTI: 15.9 cm
CHL CUP RV SYS PRESS: 34 mmHg
E decel time: 296 msec
E/e' ratio: 8.14
FS: 37 % (ref 28–44)
HEIGHTINCHES: 75 in
IVS/LV PW RATIO, ED: 0.88
LA ID, A-P, ES: 31 mm
LA vol: 59.2 mL
LADIAMINDEX: 1.58 cm/m2
LAVOLA4C: 51.5 mL
LAVOLIN: 30.2 mL/m2
LEFT ATRIUM END SYS DIAM: 31 mm
LV E/e'average: 8.14
LV PW d: 10.6 mm — AB (ref 0.6–1.1)
LV TDI E'LATERAL: 9.14
LV TDI E'MEDIAL: 5.15
LVEEMED: 8.14
LVELAT: 9.14 cm/s
LVOT area: 4.15 cm2
LVOTD: 23 mm
LVOTPV: 93.8 cm/s
LVOTSV: 66 mL
MV Dec: 296
MV pk A vel: 107 m/s
MVPG: 2 mmHg
MVPKEVEL: 74.4 m/s
P 1/2 time: 123 ms
Reg peak vel: 278 cm/s
TAPSE: 17.8 mm
TRMAXVEL: 278 cm/s
Weight: 2451.2 oz

## 2016-11-10 LAB — BASIC METABOLIC PANEL
ANION GAP: 7 (ref 5–15)
BUN: 10 mg/dL (ref 6–20)
CO2: 24 mmol/L (ref 22–32)
Calcium: 8.6 mg/dL — ABNORMAL LOW (ref 8.9–10.3)
Chloride: 110 mmol/L (ref 101–111)
Creatinine, Ser: 1.1 mg/dL (ref 0.61–1.24)
GFR calc Af Amer: >60 mL/min (ref >60)
GFR calc non Af Amer: >60 mL/min (ref >60)
Glucose, Bld: 104 mg/dL — ABNORMAL HIGH (ref 65–99)
Potassium: 4.5 mmol/L (ref 3.5–5.1)
SODIUM: 141 mmol/L (ref 135–145)

## 2016-11-10 LAB — CBC
HCT: 33.1 % — ABNORMAL LOW (ref 39.0–52.0)
HEMOGLOBIN: 11.2 g/dL — AB (ref 13.0–17.0)
MCH: 32.8 pg (ref 26.0–34.0)
MCHC: 33.8 g/dL (ref 30.0–36.0)
MCV: 97.1 fL (ref 78.0–100.0)
Platelets: 182 10*3/uL (ref 150–400)
RBC: 3.41 MIL/uL — AB (ref 4.22–5.81)
RDW: 15.8 % — ABNORMAL HIGH (ref 11.5–15.5)
WBC: 4.5 10*3/uL (ref 4.0–10.5)

## 2016-11-10 MED ORDER — CARVEDILOL 3.125 MG PO TABS: 3.1250 mg | ORAL_TABLET | Freq: Two times a day (BID) | ORAL | 1 refills | Status: AC

## 2016-11-10 NOTE — Care Management Note (Signed)
Case Management Note  Patient Details  Name: Spencer Chavez MRN: [id number] Date of Birth: 08-20-1950  Subjective/Objective:   PTA independent from home with adult daughter.  Pt has PCP and denied barriers to obtaining medications when prescribed.  CM will continue to follow for discharge needs                 Action/Plan:   Expected Discharge Date:  11/10/16               Expected Discharge Plan:  Home/Self Care  In-House Referral:     Discharge planning Services  CM Consult  Post Acute Care Choice:    Choice offered to:     DME Arranged:    DME Agency:     HH Arranged:    HH Agency:     Status of Service:  Completed, signed off  If discussed at [employer] of Stay Meetings, dates discussed:    Additional Comments:  Maryclare Labrador, RN 11/10/2016, 1:10 PM

## 2016-11-10 NOTE — Discharge Summary (Signed)
Physician Discharge Summary  Spencer Chavez LZJ:[id number] DOB: [birth year] DOA: 11/08/2016  PCP: Wyvonnia Dusky, MD  Admit date: 11/08/2016 Discharge date: 11/10/2016  Recommendations for Outpatient Follow-up:  1. Pt will need to follow up with PCP in 1-2 weeks post discharge 2. Please obtain BMP to evaluate electrolytes and kidney function 3. Please also check CBC to evaluate Hg and Hct levels 4. Stop taking lisinopril until you are seen by PCP to make sure kidney function stable  5. Dose of Coreg reduced until BP stabilized   Discharge Diagnoses:  Principal Problem:   Hypotension Active Problems:   Cardiomyopathy, alcoholic ([place])   Essential hypertension   Acquired hypothyroidism   Chronic combined systolic and diastolic congestive heart failure (HCC)   AKI (acute kidney injury) (HCC)   Protein-calorie malnutrition, severe  Discharge Condition: Stable  Diet recommendation: Heart healthy diet discussed in details   Brief Narrative:  66 y.o.malewith alcoholic cardiomyopathy, HTN, hypothyroidism, and history of Hodgkin's diseasewho presened with dizziness for 1 month associated with 13 lbs + weight loss due to grievance after his wife passed away.   Assessment & Plan:  Hypotension, dehydration: Despite lactate greater than 2, he has no fever and leukocytosis, nor localizing symptoms of infection, infection is doubted. Suspect this is dehydration, alcohol use, and antihypertensive use. - responded to IVF, SBP now in 110 - 120's - stop IVF, pt eating and wants to go home - lowered the dose of Coreg on discharge   Acute kidney injury - Suspect pre-renal plus ACEi related. - continue to hold ACEi on discharge - Cr now WNL  Hypothyroidism - Continue levothyroxine  Essential hypertension and CVA prevention and hx of CM - stopped ACEI, continue Coreg but at the lower dose on discharge   Mild elevation in trop - from dehydration  - no chest pain this  AM - trop elevation not in ACS pattern   Alcohol use - no signs of withdrawal  Grief - Encouraged counsleing at his PCP's office. Emotional support offered. - Continue mirtazapine  DVT prophylaxis: Lovenox SQ Code Status: Full  Family Communication: Patient at bedside  Disposition Plan: home  Consultants:   None  Procedures:   None  Antimicrobials:   None  Procedures/Studies: Dg Chest 2 View  Result Date: 11/08/2016 CLINICAL DATA:  Dizziness for 1 month.  Hypotension. EXAM: CHEST  2 VIEW COMPARISON:  None. FINDINGS: Atherosclerotic calcification of the aortic arch. Heart size within normal limits. The lungs appear clear. Abdominal aortic atherosclerotic calcification. No significant bony abnormality. IMPRESSION: 1. Atherosclerosis, but no acute radiographic findings. Electronically Signed   By: Van Clines M.D.   On: 11/08/2016 18:57     Discharge Exam: Vitals:   11/10/16 0022 11/10/16 0512  BP: 125/68 118/68  Pulse: 74 73  Resp: 18 18  Temp: 98.5 F (36.9 C) 98.6 F (37 C)   Vitals:   11/09/16 2125 11/10/16 0022 11/10/16 0507 11/10/16 0512  BP: 112/67 125/68  118/68  Pulse: 80 74  73  Resp: 18 18  18   Temp: 98.8 F (37.1 C) 98.5 F (36.9 C)  98.6 F (37 C)  TempSrc:      SpO2: 99% 99%  100%  Weight:   69.5 kg (153 lb 3.2 oz)   Height:        General: Pt is alert, follows commands appropriately, not in acute distress Cardiovascular: Regular rate and rhythm, S1/S2 +, no murmurs, no rubs, no gallops Respiratory: Clear to auscultation bilaterally, no wheezing,  no crackles, no rhonchi Abdominal: Soft, non tender, non distended, bowel sounds +, no guarding Extremities: no edema, no cyanosis, pulses palpable bilaterally DP and PT Neuro: Grossly nonfocal  Discharge Instructions  Discharge Instructions    Diet - low sodium heart healthy    Complete by:  As directed    Increase activity slowly    Complete by:  As directed      Allergies  as of 11/10/2016   No Known Allergies     Medication List    STOP taking these medications   lisinopril 10 MG tablet Commonly known as:  PRINIVIL,ZESTRIL     TAKE these medications   aspirin EC 81 MG tablet Take 81 mg by mouth daily.   atorvastatin 40 MG tablet Commonly known as:  LIPITOR Take 40 mg by mouth daily.   carvedilol 3.125 MG tablet Commonly known as:  COREG Take 1 tablet (3.125 mg total) by mouth 2 (two) times daily. What changed:  medication strength  how much to take   levothyroxine 50 MCG tablet Commonly known as:  SYNTHROID, LEVOTHROID Take 50 mcg by mouth daily before breakfast.   mirtazapine 15 MG tablet Commonly known as:  REMERON Take 15 mg by mouth at bedtime.      Follow-up Information    Wyvonnia Dusky, MD Follow up.   Specialty:  Cardiology Contact information: [place] [address] [phone]            The results of significant diagnostics from this hospitalization (including imaging, microbiology, ancillary and laboratory) are listed below for reference.     Microbiology: No results found for this or any previous visit (from the past 240 hour(s)).   Labs: Basic Metabolic Panel:  Recent Labs Lab 11/08/16 1715 11/09/16 0513 11/10/16 0635  NA 136 137 141  K 4.2 4.5 4.5  CL 103 111 110  CO2 20* 23 24  GLUCOSE 120* 106* 104*  BUN 29* 20 10  CREATININE 2.06* 1.45* 1.10  CALCIUM 9.0 7.8* 8.6*   Liver Function Tests:  Recent Labs Lab 11/09/16 0513  AST 47*  ALT 39  ALKPHOS 44  BILITOT 0.6  PROT 5.4*  ALBUMIN 3.0*   CBC:  Recent Labs Lab 11/08/16 1715 11/09/16 0513 11/10/16 0635  WBC 6.5 4.4 4.5  HGB 13.3 11.1* 11.2*  HCT 38.5* 32.1* 33.1*  MCV 96.0 97.3 97.1  PLT 203 168 182   Cardiac Enzymes:  Recent Labs Lab 11/09/16 0513  TROPONINI 0.04*   BNP: BNP (last 3 results)  Recent Labs  11/08/16 1943  BNP 16.2   CBG:  Recent Labs Lab 11/08/16 1713   GLUCAP 121*   SIGNED: Time coordinating discharge: 30 minutes  Faye Ramsay, MD  [facility] 11/10/2016, 12:26 PM Pager [phone]  If 7PM-7AM, please contact night-coverage [email or web address] Password [email or web address]

## 2016-11-10 NOTE — Progress Notes (Signed)
  Echocardiogram 2D Echocardiogram has been performed.  Spencer Chavez 11/10/2016, 11:24 AM

## 2016-11-10 NOTE — Discharge Instructions (Signed)
Acute Kidney Injury, Adult Acute kidney injury is a sudden worsening of kidney function. The kidneys are organs that have several jobs. They filter the blood to remove waste products and extra fluid. They also maintain a healthy balance of minerals and hormones in the body, which helps control blood pressure and keep bones strong. With this condition, your kidneys do not do their jobs as well as they should. This condition ranges from mild to severe. Over time it may develop into long-lasting (chronic) kidney disease. Early detection and treatment may prevent acute kidney injury from developing into a chronic condition. What are the causes? Common causes of this condition include:  A problem with blood flow to the kidneys. This may be caused by:  Low blood pressure (hypotension) or shock.  Blood loss.  Heart and blood vessel (cardiovascular) disease.  Severe burns.  Liver disease.  Direct damage to the kidneys. This may be caused by:  Certain medicines.  A kidney infection.  Poisoning.  Being around or in contact with toxic substances.  A surgical wound.  A hard, direct hit to the kidney area.  A sudden blockage of urine flow. This may be caused by:  Cancer.  Kidney stones.  An enlarged prostate in males. What are the signs or symptoms? Symptoms of this condition may not be obvious until the condition becomes severe. Symptoms of this condition can include:  Tiredness (lethargy), or difficulty staying awake.  Nausea or vomiting.  Swelling (edema) of the face, legs, ankles, or feet.  Problems with urination, such as:  Abdominal pain, or pain along the side of your stomach (flank).  Decreased urine production.  Decrease in the force of urine flow.  Muscle twitches and cramps, especially in the legs.  Confusion or trouble concentrating.  Loss of appetite.  Fever. How is this diagnosed? This condition may be diagnosed with tests, including:  Blood  tests.  Urine tests.  Imaging tests.  A test in which a sample of tissue is removed from the kidneys to be examined under a microscope (kidney biopsy). How is this treated? Treatment for this condition depends on the cause and how severe the condition is. In mild cases, treatment may not be needed. The kidneys may heal on their own. In more severe cases, treatment will involve:  Treating the cause of the kidney injury. This may involve changing any medicines you are taking or adjusting your dosage.  Fluids. You may need specialized IV fluids to balance your body's needs.  Having a catheter placed to drain urine and prevent blockages.  Preventing problems from occurring. This may mean avoiding certain medicines or procedures that can cause further injury to the kidneys. In some cases treatment may also require:  A procedure to remove toxic wastes from the body (dialysis or continuous renal replacement therapy - CRRT).  Surgery. This may be done to repair a torn kidney, or to remove the blockage from the urinary system. Follow these instructions at home: Medicines   Take over-the-counter and prescription medicines only as told by your health care provider.  Do not take any new medicines without your health care provider's approval. Many medicines can worsen your kidney damage.  Do not take any vitamin and mineral supplements without your health care provider's approval. Many nutritional supplements can worsen your kidney damage. Lifestyle   If your health care provider prescribed changes to your diet, follow them. You may need to decrease the amount of protein you eat.  Achieve and maintain a   healthy weight. If you need help with this, ask your health care provider.  Start or continue an exercise plan. Try to exercise at least 30 minutes a day, 5 days a week.  Do not use any tobacco products, such as cigarettes, chewing tobacco, and e-cigarettes. If you need help quitting, ask  your health care provider. General instructions   Keep track of your blood pressure. Report changes in your blood pressure as told by your health care provider.  Stay up to date with immunizations. Ask your health care provider which immunizations you need.  Keep all follow-up visits as told by your health care provider. This is important. Where to find more information:  American Association of Kidney Patients: www.aakp.org  National Kidney Foundation: www.kidney.org  American Kidney Fund: www.akfinc.org  Life [employer] [facility]:  www.lifeoptions.org  www.kidneyschool.org Contact a health care provider if:  Your symptoms get worse.  You develop new symptoms. Get help right away if:  You develop symptoms of worsening kidney disease, which include:  Headaches.  Abnormally dark or light skin.  Easy bruising.  Frequent hiccups.  Chest pain.  Shortness of breath.  End of menstruation in women.  Seizures.  Confusion or altered mental status.  Abdominal or back pain.  Itchiness.  You have a fever.  Your body is producing less urine.  You have pain or bleeding when you urinate. Summary  Acute kidney injury is a sudden worsening of kidney function.  Acute kidney injury can be caused by problems with blood flow to the kidneys, direct damage to the kidneys, and sudden blockage of urine flow.  Symptoms of this condition may not be obvious until it becomes severe. Symptoms may include edema, lethargy, confusion, nausea or vomiting, and problems passing urine.  This condition can usually be diagnosed with blood tests, urine tests, and imaging tests. Sometimes a kidney biopsy is done to diagnose this condition.  Treatment for this condition often involves treating the underlying cause. It is treated with fluids, medicines, dialysis, diet changes, or surgery. This information is not intended to replace advice given to you by your health care provider.  Make sure you discuss any questions you have with your health care provider. Document Released: 01/28/2011 Document Revised: 07/05/2016 Document Reviewed: 07/05/2016 Elsevier Interactive Patient Education  2017 Elsevier Inc.  

## 2016-11-10 NOTE — Progress Notes (Signed)
Nsg Discharge Note  Admit Date:  11/08/2016 Discharge date: 11/10/2016   Sevag Shearn to be D/C'd Home per MD order.  AVS completed.  Copy for chart, and copy for patient signed, and dated. Patient/caregiver able to verbalize understanding.  Discharge Medication: Allergies as of 11/10/2016   No Known Allergies     Medication List    STOP taking these medications   lisinopril 10 MG tablet Commonly known as:  PRINIVIL,ZESTRIL     TAKE these medications   aspirin EC 81 MG tablet Take 81 mg by mouth daily.   atorvastatin 40 MG tablet Commonly known as:  LIPITOR Take 40 mg by mouth daily.   carvedilol 3.125 MG tablet Commonly known as:  COREG Take 1 tablet (3.125 mg total) by mouth 2 (two) times daily. What changed:  medication strength  how much to take   levothyroxine 50 MCG tablet Commonly known as:  SYNTHROID, LEVOTHROID Take 50 mcg by mouth daily before breakfast.   mirtazapine 15 MG tablet Commonly known as:  REMERON Take 15 mg by mouth at bedtime.       Discharge Assessment: Vitals:   11/10/16 0022 11/10/16 0512  BP: 125/68 118/68  Pulse: 74 73  Resp: 18 18  Temp: 98.5 F (36.9 C) 98.6 F (37 C)   Skin clean, dry and intact without evidence of skin break down, no evidence of skin tears noted. IV catheter discontinued intact. Site without signs and symptoms of complications - no redness or edema noted at insertion site, patient denies c/o pain - only slight tenderness at site.  Dressing with slight pressure applied.  D/c Instructions-Education: Discharge instructions given to patient/family with verbalized understanding. D/c education completed with patient/family including follow up instructions, medication list, d/c activities limitations if indicated, with other d/c instructions as indicated by MD - patient able to verbalize understanding, all questions fully answered. Patient instructed to return to ED, call 911, or call MD for any changes in  condition.  Patient escorted via [place], and D/C home via private auto.  Salley Slaughter, RN 11/10/2016 1:30 PM

## 2017-08-01 ENCOUNTER — Encounter (HOSPITAL_COMMUNITY): Payer: Self-pay

## 2017-08-01 ENCOUNTER — Other Ambulatory Visit: Payer: Self-pay

## 2017-08-01 ENCOUNTER — Emergency Department (HOSPITAL_COMMUNITY)
Admission: EM | Admit: 2017-08-01 | Discharge: 2017-08-01 | Disposition: A | Discharge status: Home / Self Care | Payer: Medicare Other | Attending: Emergency Medicine | Admitting: Emergency Medicine

## 2017-08-01 DIAGNOSIS — Z7982 Long term (current) use of aspirin: Secondary | ICD-10-CM | POA: Insufficient documentation

## 2017-08-01 DIAGNOSIS — Z79899 Other long term (current) drug therapy: Secondary | ICD-10-CM | POA: Diagnosis not present

## 2017-08-01 DIAGNOSIS — E039 Hypothyroidism, unspecified: Secondary | ICD-10-CM | POA: Diagnosis not present

## 2017-08-01 DIAGNOSIS — I5042 Chronic combined systolic (congestive) and diastolic (congestive) heart failure: Secondary | ICD-10-CM | POA: Insufficient documentation

## 2017-08-01 DIAGNOSIS — I110 Hypertensive heart disease with heart failure: Secondary | ICD-10-CM | POA: Insufficient documentation

## 2017-08-01 DIAGNOSIS — L0291 Cutaneous abscess, unspecified: Secondary | ICD-10-CM

## 2017-08-01 DIAGNOSIS — F17210 Nicotine dependence, cigarettes, uncomplicated: Secondary | ICD-10-CM | POA: Insufficient documentation

## 2017-08-01 DIAGNOSIS — L0211 Cutaneous abscess of neck: Secondary | ICD-10-CM | POA: Insufficient documentation

## 2017-08-01 MED ORDER — LIDOCAINE HCL (PF) 1 % IJ SOLN
5.0000 mL | Freq: Once | INTRAMUSCULAR | Status: AC
  Administered 2017-08-01: 5 mL
  Filled 2017-08-01: qty 5

## 2017-08-01 MED ORDER — DOXYCYCLINE HYCLATE 100 MG PO CAPS: 100.0000 mg | ORAL_CAPSULE | Freq: Two times a day (BID) | ORAL | 0 refills | Status: AC

## 2017-08-01 NOTE — ED Provider Notes (Signed)
  Physical Exam  BP (!) 160/110 (BP Location: Right Arm)   Pulse 81   Temp 98.2 F (36.8 C) (Oral)   Resp 16   SpO2 97%    ED Course/Procedures     Procedures Procedures .Marland KitchenIncision and Drainage Date/Time: 08/01/2017 2:56 PM Performed by: Rodney Booze, PA-C Authorized by: Rodney Booze, PA-C   Consent:    Consent obtained:  Verbal   Consent given by:  Patient   Risks discussed:  Incomplete drainage and pain Location:    Type:  Abscess   Size:  2.5   Location:  Neck   Neck location: just right of midline, posterior. Pre-procedure details:    Skin preparation:  Betadine Anesthesia (see MAR for exact dosages):    Anesthesia method:  Local infiltration   Local anesthetic:  Lidocaine 1% w/o epi Procedure type:    Complexity:  Simple Procedure details:    Needle aspiration: no     Incision types:  Single straight   Incision depth:  Dermal   Scalpel blade:  11   Wound management:  Probed and deloculated and irrigated with saline   Drainage:  Purulent and bloody   Drainage amount:  Scant   Wound treatment:  Wound left open   Packing materials:  None Post-procedure details:    Patient tolerance of procedure:  Tolerated well, no immediate complications      Rodney Booze, PA-C 08/01/17 1502    Isla Pence, MD 08/01/17 [phone]

## 2017-08-01 NOTE — ED Provider Notes (Signed)
[place] EMERGENCY DEPARTMENT Provider Note   CSN: [id number] Arrival date & time: 08/01/17  1045     History   Chief Complaint Chief Complaint  Patient presents with  . Abscess    HPI Spencer Chavez is a 67 y.o. male presenting for evaluation of lesion on his neck.  Patient states that for the past 3 days, he has had a lesion on his posterior neck.  It is growing more painful and swollen.  Nothing has drained from it.  He denies fevers, chills, nausea, vomiting, or pain with movement of his head.  He denies lesions elsewhere. He has not taken anything for pain.  He is not immunocompromised, no h/o DM.  He is not on blood thinners.  Last tetanus shot was within the past 10 years.    HPI  Past Medical History:  Diagnosis Date  . Alcoholic cardiomyopathy ([place])   . Hodgkin disease ([place])   . Hypercholesteremia   . Hypertension   . Thyroid disease     Patient Active Problem List   Diagnosis Date Noted  . Protein-calorie malnutrition, severe 11/09/2016  . Hypotension 11/08/2016  . Essential hypertension 11/08/2016  . AKI (acute kidney injury) ([place]) 11/08/2016  . Acquired hypothyroidism 05/31/2015  . Chronic combined systolic and diastolic congestive heart failure ([place]) 05/31/2015  . Cardiomyopathy, alcoholic ([place]) 76/54/6503    Past Surgical History:  Procedure Laterality Date  . LEFT HEART CATHETERIZATION WITH CORONARY ANGIOGRAM N/A 11/08/2014   Procedure: LEFT HEART CATHETERIZATION WITH CORONARY ANGIOGRAM;  Surgeon: Adrian Prows, MD;  Location: [facility] CATH LAB;  Service: Cardiovascular;  Laterality: N/A;       Home Medications    Prior to Admission medications   Medication Sig Start Date End Date Taking? Authorizing Provider  aspirin EC 81 MG tablet Take 81 mg by mouth daily.    [provider]  atorvastatin (LIPITOR) 40 MG tablet Take 40 mg by mouth daily.    [provider]  carvedilol (COREG) 3.125 MG tablet Take 1 tablet  (3.125 mg total) by mouth 2 (two) times daily. 11/10/16   Theodis Blaze, MD  doxycycline (VIBRAMYCIN) 100 MG capsule Take 1 capsule (100 mg total) by mouth 2 (two) times daily. 08/01/17   Kleber Crean, PA-C  levothyroxine (SYNTHROID, LEVOTHROID) 50 MCG tablet Take 50 mcg by mouth daily before breakfast.    [provider]  mirtazapine (REMERON) 15 MG tablet Take 15 mg by mouth at bedtime.    [provider]    Family History Family History  Problem Relation Age of Onset  . Breast cancer Mother   . Stroke Father   . Hypertension Father     Social History Social History   Tobacco Use  . Smoking status: Current Every Day Smoker    Packs/day: 0.50  . Smokeless tobacco: Never Used  Substance Use Topics  . Alcohol use: Yes    Comment: occ  . 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   Review of Systems Review of Systems  Constitutional: Negative for chills and fever.  Skin: Positive for rash.       Lesion on neck     Physical Exam Updated Vital Signs BP (!) 160/110 (BP Location: Right Arm)   Pulse 81   Temp 98.2 F (36.8 C) (Oral)   Resp 16   SpO2 97%   Physical Exam  Constitutional:  He is oriented to person, place, and time. He appears well-developed and well-nourished. No distress.  HENT:  Head: Normocephalic and atraumatic.  Eyes: EOM are normal.  Neck: Normal range of motion.  Cardiovascular: Normal rate, regular rhythm and intact distal pulses.  Pulmonary/Chest: Effort normal and breath sounds normal. No respiratory distress. He has no wheezes.  Abdominal: He exhibits no distension.  Musculoskeletal: Normal range of motion.  Neurological: He is alert and oriented to person, place, and time.  Skin: Skin is warm.  Tender fluctuant lesion of posterior neck with obvious head.  No active drainage.  No pain surrounding the lesion.  Patient with full active range of motion of the head  and neck without difficulty.  Psychiatric: He has a normal mood and affect.  Nursing note and vitals reviewed.    ED Treatments / Results  Labs (all labs ordered are listed, but only abnormal results are displayed) Labs Reviewed - No data to display  EKG  EKG Interpretation None       Radiology No results found.  Procedures Procedures (including critical care time)  Medications Ordered in ED Medications  lidocaine (PF) (XYLOCAINE) 1 % injection 5 mL (5 mLs Infiltration Given 08/01/17 1236)     Initial Impression / Assessment and Plan / ED Course  I have reviewed the triage vital signs and the nursing notes.  Pertinent labs & imaging results that were available during my care of the patient were reviewed by me and considered in my medical decision making (see chart for details).     Patient presenting with lesion on posterior neck.  No sign of systemic infection or meningitis.  Case discussed with attending, Dr. Gilford Raid evaluated the patient.  I&D performed by C Couture, PA-C.  Wound was irrigated and aftercare instructions given.  Patient given antibiotics and to follow-up with primary care.  BP slightly elevated, has apt with PCP next week. At this time, patient appears safe for discharge.  Return precautions given.  Patient states he understands and agrees to plan.   Final Clinical Impressions(s) / ED Diagnoses   Final diagnoses:  Abscess    ED Discharge Orders        Ordered    doxycycline (VIBRAMYCIN) 100 MG capsule  2 times daily     08/01/17 1419       Esaias Cleavenger, PA-C 08/01/17 1525    Isla Pence, MD 08/01/17 1541

## 2017-08-01 NOTE — ED Triage Notes (Signed)
Pt has what appears to be an abscess on the back of his neck. Sore to touch. Not notably hot to touch. It appears to have a white head on it. Pt afebrile in triage.

## 2017-08-01 NOTE — ED Notes (Signed)
Edp aware of bp  

## 2017-08-01 NOTE — Discharge Instructions (Signed)
Take antibiotics as prescribed.  Take the entire course of antibiotics, even if your symptoms improve. Use Tylenol or ibuprofen as needed for pain. If the area covered for the next 24 hours.  After this, you may wash gently with soap and water, and reapply new dressing.  Keep doing this until there is nothing draining from the wound. Follow-up with your primary care doctor in 1 week for wound recheck. Return to the emergency room if you develop fevers, worsening pain, inability to move your head or neck, or any new or concerning symptoms.

## 2017-11-26 IMAGING — CR DG CHEST 2V
2 series · 2 of 2 positions shown · non-contrast
Comparison: None.

CLINICAL DATA: Dizziness for 1 month.  Hypotension.

EXAM:
CHEST  2 VIEW

[chest lat]
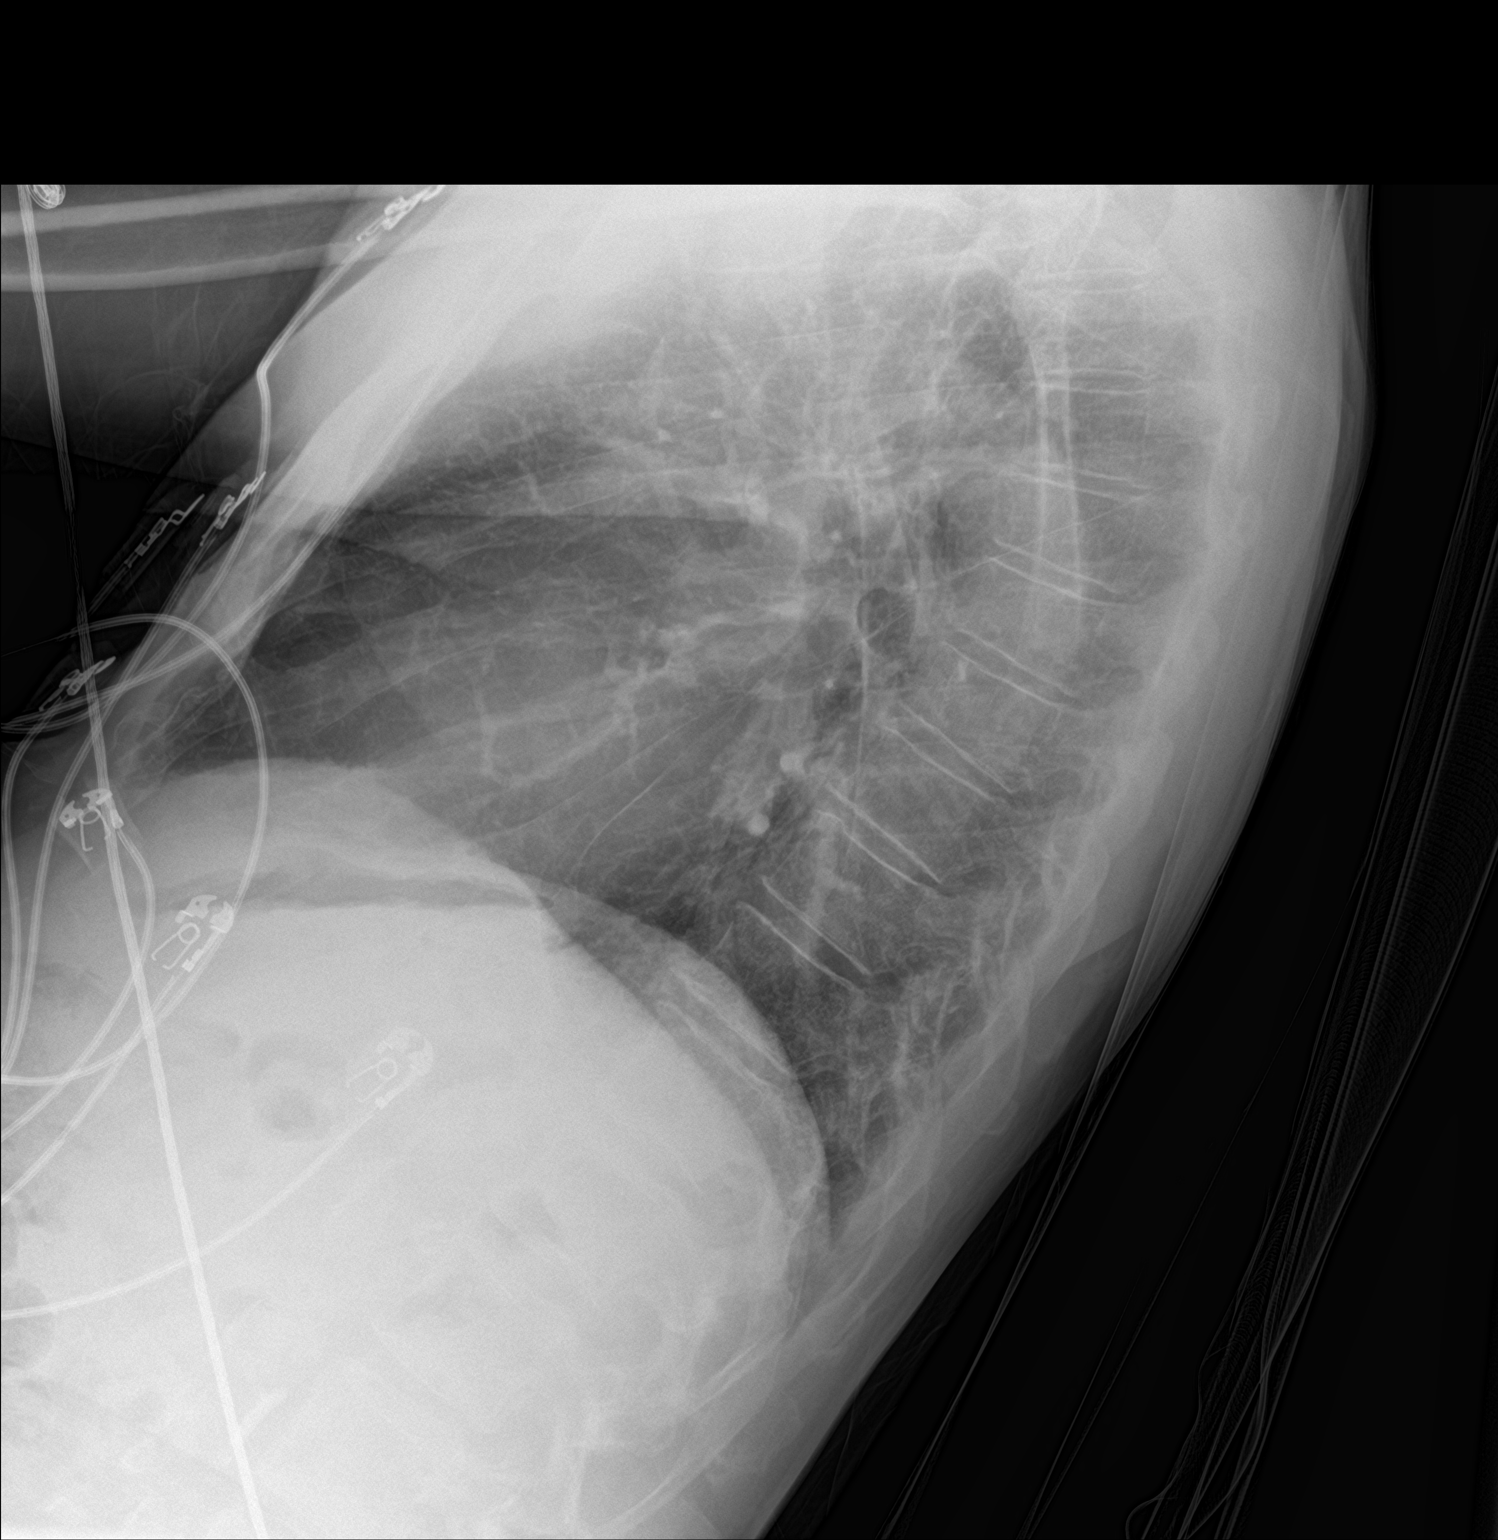

[chest ap]
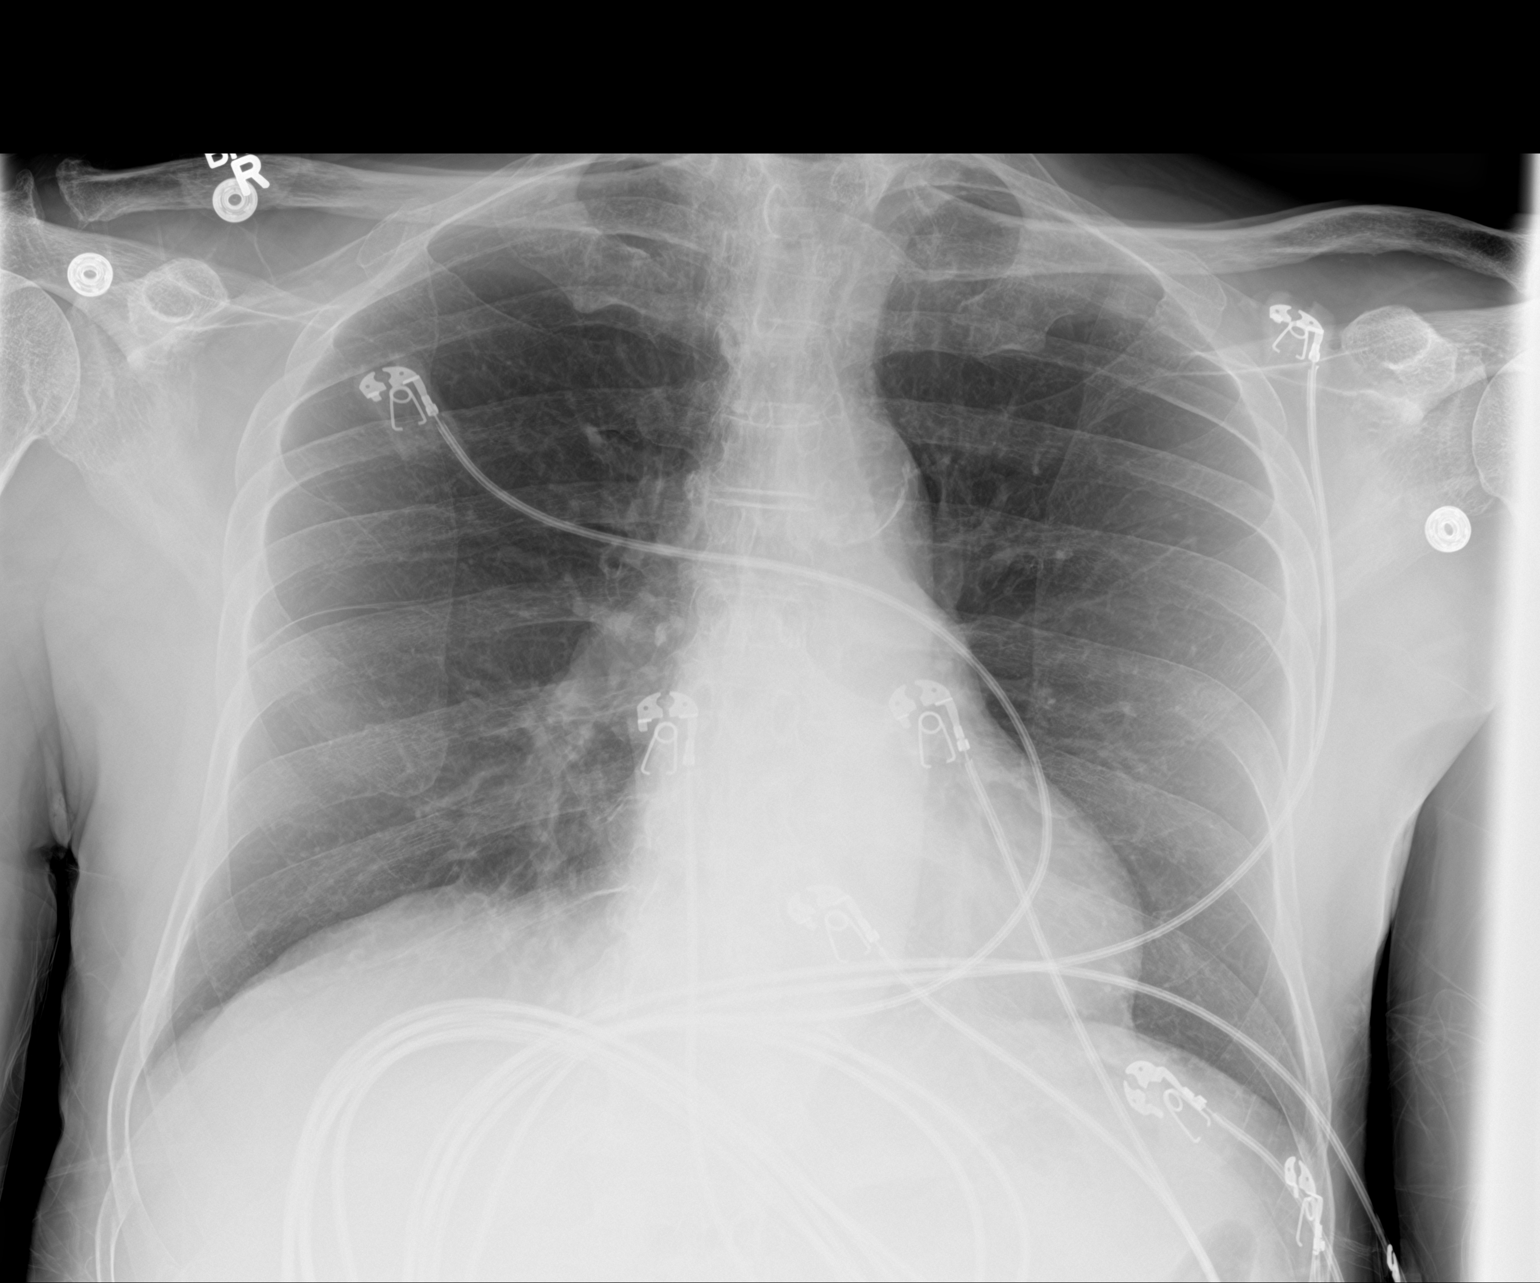

[2 of 2 positions shown; findings below may reference images not displayed]

FINDINGS: Atherosclerotic calcification of the aortic arch. Heart size within
normal limits.

The lungs appear clear. Abdominal aortic atherosclerotic
calcification.

No significant bony abnormality.
IMPRESSION: 1. Atherosclerosis, but no acute radiographic findings.
